# Patient Record
Sex: Female | Born: 1955 | Race: White | Hispanic: No | Smoking: Former smoker
Health system: Southern US, Community
[De-identification: ages and names within clinical notes are randomized; demographics above are authoritative.]

## PROBLEM LIST (undated history)

## (undated) DIAGNOSIS — N183 Chronic kidney disease, stage 3 unspecified: Secondary | ICD-10-CM

## (undated) DIAGNOSIS — I2510 Atherosclerotic heart disease of native coronary artery without angina pectoris: Secondary | ICD-10-CM

## (undated) DIAGNOSIS — E119 Type 2 diabetes mellitus without complications: Secondary | ICD-10-CM

## (undated) HISTORY — PX: CORONARY ARTERY BYPASS GRAFT: SHX141

---

## 2021-03-14 ENCOUNTER — Inpatient Hospital Stay (HOSPITAL_COMMUNITY): Payer: 59

## 2021-03-14 ENCOUNTER — Other Ambulatory Visit: Payer: Self-pay

## 2021-03-14 ENCOUNTER — Encounter (HOSPITAL_COMMUNITY): Payer: Self-pay | Admitting: Internal Medicine

## 2021-03-14 ENCOUNTER — Inpatient Hospital Stay (HOSPITAL_COMMUNITY)
Admission: AD | Admit: 2021-03-14 | Discharge: 2021-03-18 | DRG: 292 | Disposition: A | Discharge status: Home / Self Care | Payer: 59 | Source: Other Acute Inpatient Hospital | Attending: Cardiology | Admitting: Cardiology

## 2021-03-14 DIAGNOSIS — I5033 Acute on chronic diastolic (congestive) heart failure: Secondary | ICD-10-CM

## 2021-03-14 DIAGNOSIS — I5043 Acute on chronic combined systolic (congestive) and diastolic (congestive) heart failure: Secondary | ICD-10-CM | POA: Diagnosis present

## 2021-03-14 DIAGNOSIS — E119 Type 2 diabetes mellitus without complications: Secondary | ICD-10-CM

## 2021-03-14 DIAGNOSIS — N1831 Chronic kidney disease, stage 3a: Secondary | ICD-10-CM | POA: Diagnosis present

## 2021-03-14 DIAGNOSIS — I959 Hypotension, unspecified: Secondary | ICD-10-CM | POA: Diagnosis present

## 2021-03-14 DIAGNOSIS — Z951 Presence of aortocoronary bypass graft: Secondary | ICD-10-CM | POA: Diagnosis not present

## 2021-03-14 DIAGNOSIS — R6881 Early satiety: Secondary | ICD-10-CM | POA: Diagnosis present

## 2021-03-14 DIAGNOSIS — N183 Chronic kidney disease, stage 3 unspecified: Secondary | ICD-10-CM

## 2021-03-14 DIAGNOSIS — I429 Cardiomyopathy, unspecified: Secondary | ICD-10-CM | POA: Diagnosis present

## 2021-03-14 DIAGNOSIS — Z01818 Encounter for other preprocedural examination: Secondary | ICD-10-CM

## 2021-03-14 DIAGNOSIS — I2510 Atherosclerotic heart disease of native coronary artery without angina pectoris: Secondary | ICD-10-CM | POA: Diagnosis present

## 2021-03-14 DIAGNOSIS — Q250 Patent ductus arteriosus: Secondary | ICD-10-CM

## 2021-03-14 DIAGNOSIS — D509 Iron deficiency anemia, unspecified: Secondary | ICD-10-CM | POA: Diagnosis present

## 2021-03-14 DIAGNOSIS — I5021 Acute systolic (congestive) heart failure: Secondary | ICD-10-CM | POA: Diagnosis not present

## 2021-03-14 DIAGNOSIS — Z885 Allergy status to narcotic agent status: Secondary | ICD-10-CM | POA: Diagnosis not present

## 2021-03-14 DIAGNOSIS — I25810 Atherosclerosis of coronary artery bypass graft(s) without angina pectoris: Secondary | ICD-10-CM | POA: Diagnosis present

## 2021-03-14 DIAGNOSIS — F32A Depression, unspecified: Secondary | ICD-10-CM | POA: Diagnosis present

## 2021-03-14 DIAGNOSIS — E1122 Type 2 diabetes mellitus with diabetic chronic kidney disease: Secondary | ICD-10-CM | POA: Diagnosis present

## 2021-03-14 DIAGNOSIS — I255 Ischemic cardiomyopathy: Secondary | ICD-10-CM | POA: Diagnosis present

## 2021-03-14 DIAGNOSIS — I340 Nonrheumatic mitral (valve) insufficiency: Secondary | ICD-10-CM | POA: Diagnosis present

## 2021-03-14 DIAGNOSIS — I2582 Chronic total occlusion of coronary artery: Secondary | ICD-10-CM | POA: Diagnosis present

## 2021-03-14 DIAGNOSIS — F419 Anxiety disorder, unspecified: Secondary | ICD-10-CM | POA: Diagnosis present

## 2021-03-14 HISTORY — DX: Atherosclerotic heart disease of native coronary artery without angina pectoris: I25.10

## 2021-03-14 HISTORY — DX: Chronic kidney disease, stage 3 unspecified: N18.30

## 2021-03-14 HISTORY — DX: Type 2 diabetes mellitus without complications: E11.9

## 2021-03-14 LAB — IRON AND TIBC
Iron: 18 ug/dL — ABNORMAL LOW (ref 28–170)
Saturation Ratios: 6 % — ABNORMAL LOW (ref 10.4–31.8)
TIBC: 309 ug/dL (ref 250–450)
UIBC: 291 ug/dL

## 2021-03-14 LAB — COMPREHENSIVE METABOLIC PANEL
ALT: 20 U/L (ref 0–44)
AST: 24 U/L (ref 15–41)
Albumin: 3 g/dL — ABNORMAL LOW (ref 3.5–5.0)
Alkaline Phosphatase: 84 U/L (ref 38–126)
Anion gap: 12 (ref 5–15)
BUN: 21 mg/dL (ref 8–23)
CO2: 25 mmol/L (ref 22–32)
Calcium: 9.1 mg/dL (ref 8.9–10.3)
Chloride: 97 mmol/L — ABNORMAL LOW (ref 98–111)
Creatinine, Ser: 1.21 mg/dL — ABNORMAL HIGH (ref 0.44–1.00)
GFR, Estimated: 50 mL/min — ABNORMAL LOW (ref >60)
Glucose, Bld: 195 mg/dL — ABNORMAL HIGH (ref 70–99)
Potassium: 3.7 mmol/L (ref 3.5–5.1)
Sodium: 134 mmol/L — ABNORMAL LOW (ref 135–145)
Total Bilirubin: 0.6 mg/dL (ref 0.3–1.2)
Total Protein: 6.5 g/dL (ref 6.5–8.1)

## 2021-03-14 LAB — LIPID PANEL
Cholesterol: 64 mg/dL (ref 0–200)
HDL: 36 mg/dL — ABNORMAL LOW (ref >40)
LDL Cholesterol: 10 mg/dL (ref 0–99)
Total CHOL/HDL Ratio: 1.8 RATIO
Triglycerides: 92 mg/dL (ref <150)
VLDL: 18 mg/dL (ref 0–40)

## 2021-03-14 LAB — CBC WITH DIFFERENTIAL/PLATELET
Abs Immature Granulocytes: 0.02 10*3/uL (ref 0.00–0.07)
Basophils Absolute: 0.1 10*3/uL (ref 0.0–0.1)
Basophils Relative: 1 %
Eosinophils Absolute: 0.1 10*3/uL (ref 0.0–0.5)
Eosinophils Relative: 2 %
HCT: 27.6 % — ABNORMAL LOW (ref 36.0–46.0)
Hemoglobin: 9 g/dL — ABNORMAL LOW (ref 12.0–15.0)
Immature Granulocytes: 0 %
Lymphocytes Relative: 21 %
Lymphs Abs: 1.3 10*3/uL (ref 0.7–4.0)
MCH: 26.2 pg (ref 26.0–34.0)
MCHC: 32.6 g/dL (ref 30.0–36.0)
MCV: 80.5 fL (ref 80.0–100.0)
Monocytes Absolute: 0.6 10*3/uL (ref 0.1–1.0)
Monocytes Relative: 10 %
Neutro Abs: 4.1 10*3/uL (ref 1.7–7.7)
Neutrophils Relative %: 66 %
Platelets: 129 10*3/uL — ABNORMAL LOW (ref 150–400)
RBC: 3.43 MIL/uL — ABNORMAL LOW (ref 3.87–5.11)
RDW: 15.8 % — ABNORMAL HIGH (ref 11.5–15.5)
WBC: 6.2 10*3/uL (ref 4.0–10.5)
nRBC: 0 % (ref 0.0–0.2)

## 2021-03-14 LAB — GLUCOSE, CAPILLARY
Glucose-Capillary: 182 mg/dL — ABNORMAL HIGH (ref 70–99)
Glucose-Capillary: 174 mg/dL — ABNORMAL HIGH (ref 70–99)
Glucose-Capillary: 150 mg/dL — ABNORMAL HIGH (ref 70–99)
Glucose-Capillary: 153 mg/dL — ABNORMAL HIGH (ref 70–99)

## 2021-03-14 LAB — COOXEMETRY PANEL
Carboxyhemoglobin: 1.1 % (ref 0.5–1.5)
Methemoglobin: 0.8 % (ref 0.0–1.5)
O2 Saturation: 65.6 %
Total hemoglobin: 9.8 g/dL — ABNORMAL LOW (ref 12.0–16.0)

## 2021-03-14 LAB — MRSA NEXT GEN BY PCR, NASAL: MRSA by PCR Next Gen: NOT DETECTED

## 2021-03-14 LAB — HIV ANTIBODY (ROUTINE TESTING W REFLEX): HIV Screen 4th Generation wRfx: NONREACTIVE

## 2021-03-14 LAB — HEMOGLOBIN A1C
Hgb A1c MFr Bld: 7.6 % — ABNORMAL HIGH (ref 4.8–5.6)
Mean Plasma Glucose: 171.42 mg/dL

## 2021-03-14 LAB — T4, FREE: Free T4: 1.32 ng/dL — ABNORMAL HIGH (ref 0.61–1.12)

## 2021-03-14 LAB — FERRITIN: Ferritin: 85 ng/mL (ref 11–307)

## 2021-03-14 LAB — BRAIN NATRIURETIC PEPTIDE: B Natriuretic Peptide: 748 pg/mL — ABNORMAL HIGH (ref 0.0–100.0)

## 2021-03-14 LAB — TSH: TSH: 6.021 u[IU]/mL — ABNORMAL HIGH (ref 0.350–4.500)

## 2021-03-14 MED ORDER — FUROSEMIDE 10 MG/ML IJ SOLN
60.0000 mg | Freq: Once | INTRAMUSCULAR | Status: AC
  Administered 2021-03-14: 60 mg via INTRAVENOUS
  Filled 2021-03-14: qty 6

## 2021-03-14 MED ORDER — EMPAGLIFLOZIN 10 MG PO TABS
10.0000 mg | ORAL_TABLET | Freq: Every day | ORAL | Status: DC
  Administered 2021-03-14: 10 mg via ORAL
  Filled 2021-03-14: qty 1

## 2021-03-14 MED ORDER — MILRINONE LACTATE IN DEXTROSE 20-5 MG/100ML-% IV SOLN
0.1250 ug/kg/min | INTRAVENOUS | Status: DC
Start: 2021-03-14 — End: 2021-03-16
  Administered 2021-03-14 – 2021-03-15 (×2): 0.25 ug/kg/min via INTRAVENOUS
  Administered 2021-03-15: 0.125 ug/kg/min via INTRAVENOUS
  Filled 2021-03-14 (×3): qty 100

## 2021-03-14 MED ORDER — CLOPIDOGREL BISULFATE 75 MG PO TABS
75.0000 mg | ORAL_TABLET | Freq: Every day | ORAL | Status: DC
  Administered 2021-03-14 – 2021-03-18 (×5): 75 mg via ORAL
  Filled 2021-03-14 (×5): qty 1

## 2021-03-14 MED ORDER — SODIUM CHLORIDE 0.9% FLUSH: 3.0000 mL | INTRAVENOUS | Status: DC | PRN

## 2021-03-14 MED ORDER — ONDANSETRON HCL 4 MG/2ML IJ SOLN: 4.0000 mg | Freq: Four times a day (QID) | INTRAMUSCULAR | Status: DC | PRN

## 2021-03-14 MED ORDER — POTASSIUM CHLORIDE CRYS ER 20 MEQ PO TBCR
40.0000 meq | EXTENDED_RELEASE_TABLET | Freq: Once | ORAL | Status: AC
  Administered 2021-03-14: 40 meq via ORAL
  Filled 2021-03-14: qty 2

## 2021-03-14 MED ORDER — ASPIRIN EC 81 MG PO TBEC
81.0000 mg | DELAYED_RELEASE_TABLET | Freq: Every day | ORAL | Status: DC
  Administered 2021-03-14 – 2021-03-18 (×5): 81 mg via ORAL
  Filled 2021-03-14 (×5): qty 1

## 2021-03-14 MED ORDER — FUROSEMIDE 10 MG/ML IJ SOLN
80.0000 mg | Freq: Two times a day (BID) | INTRAMUSCULAR | Status: DC
  Administered 2021-03-14: 80 mg via INTRAVENOUS
  Filled 2021-03-14: qty 8

## 2021-03-14 MED ORDER — SPIRONOLACTONE 25 MG PO TABS
25.0000 mg | ORAL_TABLET | Freq: Every day | ORAL | Status: DC
Start: 2021-03-14 — End: 2021-03-18
  Administered 2021-03-14 – 2021-03-18 (×5): 25 mg via ORAL
  Filled 2021-03-14 (×5): qty 1

## 2021-03-14 MED ORDER — SODIUM CHLORIDE 0.9 % IV SOLN
510.0000 mg | Freq: Once | INTRAVENOUS | Status: AC
  Administered 2021-03-14: 510 mg via INTRAVENOUS
  Filled 2021-03-14: qty 17

## 2021-03-14 MED ORDER — SODIUM CHLORIDE 0.9% FLUSH: 10.0000 mL | INTRAVENOUS | Status: DC | PRN

## 2021-03-14 MED ORDER — DIGOXIN 125 MCG PO TABS
0.1250 mg | ORAL_TABLET | Freq: Every day | ORAL | Status: DC
  Administered 2021-03-14 – 2021-03-17 (×4): 0.125 mg via ORAL
  Filled 2021-03-14 (×4): qty 1

## 2021-03-14 MED ORDER — ENSURE ENLIVE PO LIQD
237.0000 mL | Freq: Two times a day (BID) | ORAL | Status: DC
  Administered 2021-03-14 – 2021-03-18 (×7): 237 mL via ORAL

## 2021-03-14 MED ORDER — SODIUM CHLORIDE 0.9% FLUSH
3.0000 mL | Freq: Two times a day (BID) | INTRAVENOUS | Status: DC
  Administered 2021-03-14 – 2021-03-18 (×6): 3 mL via INTRAVENOUS

## 2021-03-14 MED ORDER — SERTRALINE HCL 25 MG PO TABS
50.0000 mg | ORAL_TABLET | Freq: Every day | ORAL | Status: DC
  Administered 2021-03-14 – 2021-03-18 (×5): 50 mg via ORAL
  Filled 2021-03-14 (×5): qty 2

## 2021-03-14 MED ORDER — SODIUM CHLORIDE 0.9% FLUSH
10.0000 mL | Freq: Two times a day (BID) | INTRAVENOUS | Status: DC
  Administered 2021-03-14 – 2021-03-17 (×6): 10 mL
  Administered 2021-03-17: 40 mL
  Administered 2021-03-18: 10 mL

## 2021-03-14 MED ORDER — ACETAMINOPHEN 325 MG PO TABS: 650.0000 mg | ORAL_TABLET | ORAL | Status: DC | PRN

## 2021-03-14 MED ORDER — ATORVASTATIN CALCIUM 80 MG PO TABS
80.0000 mg | ORAL_TABLET | Freq: Every day | ORAL | Status: DC
  Administered 2021-03-14 – 2021-03-18 (×5): 80 mg via ORAL
  Filled 2021-03-14 (×5): qty 1

## 2021-03-14 MED ORDER — INSULIN ASPART 100 UNIT/ML IJ SOLN
0.0000 [IU] | Freq: Three times a day (TID) | INTRAMUSCULAR | Status: DC
Start: 2021-03-14 — End: 2021-03-18
  Administered 2021-03-14: 3 [IU] via SUBCUTANEOUS
  Administered 2021-03-14: 2 [IU] via SUBCUTANEOUS
  Administered 2021-03-14 – 2021-03-16 (×7): 3 [IU] via SUBCUTANEOUS
  Administered 2021-03-17: 2 [IU] via SUBCUTANEOUS
  Administered 2021-03-17 – 2021-03-18 (×4): 3 [IU] via SUBCUTANEOUS

## 2021-03-14 MED ORDER — ENOXAPARIN SODIUM 40 MG/0.4ML IJ SOSY
40.0000 mg | PREFILLED_SYRINGE | INTRAMUSCULAR | Status: DC
  Administered 2021-03-14 – 2021-03-18 (×4): 40 mg via SUBCUTANEOUS
  Filled 2021-03-14 (×5): qty 0.4

## 2021-03-14 MED ORDER — BUSPIRONE HCL 10 MG PO TABS
5.0000 mg | ORAL_TABLET | Freq: Three times a day (TID) | ORAL | Status: DC
  Administered 2021-03-14 – 2021-03-18 (×13): 5 mg via ORAL
  Filled 2021-03-14 (×13): qty 1

## 2021-03-14 MED ORDER — INSULIN ASPART 100 UNIT/ML IJ SOLN
0.0000 [IU] | Freq: Every day | INTRAMUSCULAR | Status: DC
  Administered 2021-03-15 – 2021-03-17 (×3): 2 [IU] via SUBCUTANEOUS

## 2021-03-14 MED ORDER — DAPAGLIFLOZIN PROPANEDIOL 10 MG PO TABS
10.0000 mg | ORAL_TABLET | Freq: Every day | ORAL | Status: DC
  Administered 2021-03-15 – 2021-03-18 (×4): 10 mg via ORAL
  Filled 2021-03-14 (×4): qty 1

## 2021-03-14 MED ORDER — SODIUM CHLORIDE 0.9 % IV SOLN: 250.0000 mL | INTRAVENOUS | Status: DC | PRN

## 2021-03-14 MED ORDER — CHLORHEXIDINE GLUCONATE CLOTH 2 % EX PADS
6.0000 | MEDICATED_PAD | Freq: Every day | CUTANEOUS | Status: DC
  Administered 2021-03-14 – 2021-03-17 (×4): 6 via TOPICAL

## 2021-03-14 NOTE — Plan of Care (Signed)
Initiation of Care Plan Problem: Education: Goal: Knowledge of General Education information will improve Description: Including pain rating scale, medication(s)/side effects and non-pharmacologic comfort measures Outcome: Progressing   Problem: Health Behavior/Discharge Planning: Goal: Ability to manage health-related needs will improve Outcome: Progressing   Problem: Clinical Measurements: Goal: Ability to maintain clinical measurements within normal limits will improve Outcome: Progressing Goal: Will remain free from infection Outcome: Progressing Goal: Diagnostic test results will improve Outcome: Progressing Goal: Respiratory complications will improve Outcome: Progressing Goal: Cardiovascular complication will be avoided Outcome: Progressing   Problem: Activity: Goal: Risk for activity intolerance will decrease Outcome: Progressing   Problem: Nutrition: Goal: Adequate nutrition will be maintained Outcome: Progressing   Problem: Coping: Goal: Level of anxiety will decrease Outcome: Progressing   Problem: Elimination: Goal: Will not experience complications related to bowel motility Outcome: Progressing Goal: Will not experience complications related to urinary retention Outcome: Progressing   Problem: Pain Managment: Goal: General experience of comfort will improve Outcome: Progressing   Problem: Safety: Goal: Ability to remain free from injury will improve Outcome: Progressing   Problem: Skin Integrity: Goal: Risk for impaired skin integrity will decrease Outcome: Progressing   Problem: Education: Goal: Ability to demonstrate management of disease process will improve Outcome: Progressing Goal: Ability to verbalize understanding of medication therapies will improve Outcome: Progressing Goal: Individualized Educational Video(s) Outcome: Progressing   Problem: Activity: Goal: Capacity to carry out activities will improve Outcome: Progressing    Problem: Cardiac: Goal: Ability to achieve and maintain adequate cardiopulmonary perfusion will improve Outcome: Progressing   

## 2021-03-14 NOTE — H&P (Signed)
Cardiology Admission History and Physical:   Patient ID: Andrea Mueller MRN: [id number]; DOB: Mar 02, 1956   Admission date: 03/14/2021  PCP:  Jeanice LimHaubert, Deborah Anne, PA-C   [facility] Providers Cardiologist:  None   Chief Complaint:  "My blood pressure dropped after they stopped this medicine so they brought me over here"  Patient Profile:   Andrea Mueller is a 65 y.o. female with hx of CAD s/p 4v CABG (11/2020), type 2 diabetes mellitus, anxiety/depression, CKD III, and recently diagnosed ICM who is being seen 03/14/2021 for the evaluation of acute on chronic systolic and diastolic heart failure.  History of Present Illness:   Andrea Mueller' was diagnosed with coronary artery disease back in April of this year after presenting to a hospital in [place] with fatigue and diaphoresis.  She underwent four-vessel CABG in [place] and appeared to initially recover quite well.  She returned to [place] in [place] in May, and was undergoing home physical therapy.  She was hospitalized in May in [place] for what appears to be decompensated heart failure and right lower leg cellulitis.  Her echocardiogram at that time revealed normal LV systolic function.  After discharge, [name] tells me that she was actually doing quite well.  She was continuing to work with home physical therapy, and was back to doing a lot of her household chores without any significant functionally limiting symptoms.  In early to mid July, however, she noted that she was starting to become a little more winded and felt pressure in her chest.  She presented to [facility] in [place] and was found to be in acute heart failure.  Her echocardiogram then revealed new, significant LV systolic dysfunction with moderate mitral regurgitation.  She underwent a left heart catheterization which revealed severe native disease, and occluded vein graft to a diagonal, and otherwise patent bypass grafts.  She had elevated biventricular  filling pressures with a normal cardiac index, although this was on a small amount of dobutamine.  She was diuresed, and the team there attempted to wean her off of inotropes.  She reports that during this process she felt as though fluid quickly came back on.  Yesterday her she was reportedly hypotensive, and inotropes were restarted.  She was transferred here for further management of her cardiomyopathy and consideration of advanced therapies.  Andrea Mueller tells me that prior to April she was quite functional.  She was working, doing all of her household chores, and handling 3 dogs without any functionally limiting symptoms.  Absent her hospitalization in May, she really did feel like she was recovering well from bypass surgery until this month.  This morning she feels as though she still has quite a bit of fluid on board.  She has a tightness in her chest that she says is typical when she has extra fluid.  She has noted a decrease in her appetite along with early satiety.  She did not notice when her blood pressure was low, but did feel like the last couple of days she was more winded when walking in the halls.  She has never had to have medications decreased or stopped in the outpatient setting because of hypotension.  She has not had any recent palpitations or syncope.   Past Medical History:  Diagnosis Date   CAD (coronary artery disease), native coronary artery    CKD (chronic kidney disease) stage 3, GFR 30-59 ml/min (HCC)    Type 2 diabetes mellitus treated without insulin (HCC)  Past Surgical History:  Procedure Laterality Date   CORONARY ARTERY BYPASS GRAFT       Medications Prior to Admission: Prior to Admission medications   Not on File     Allergies:    Allergies  Allergen Reactions   Codeine Nausea Only    Social History:   Social History   Socioeconomic History   Marital status: Widowed    Spouse name: Not on file   Number of children: Not on file   Years of  education: Not on file   Highest education level: Not on file  Occupational History   Not on file  Tobacco Use   Smoking status: Not on file   Smokeless tobacco: Not on file  Substance and Sexual Activity   Alcohol use: Not on file   Drug use: Not on file   Sexual activity: Not on file  Other Topics Concern   Not on file  Social History Narrative   Not on file   Social Determinants of Health   Financial Resource Strain: Not on file  Food Insecurity: Not on file  Transportation Needs: Not on file  Physical Activity: Not on file  Stress: Not on file  Social Connections: Not on file  Intimate Partner Violence: Not on file    Family History:   The patient's family history is not on file.    ROS:  Please see the history of present illness.  All other ROS reviewed and negative.     Physical Exam/Data:   Vitals:   03/14/21 0327 03/14/21 0332  BP: (!) 102/49 (!) 113/53  Pulse: 89 87  Resp: 18 13  Temp: 98.1 F (36.7 C)   TempSrc: Oral   SpO2: 97%   Weight: 81.6 kg   Height: 5\' 3"  (1.6 m)    No intake or output data in the 24 hours ending 03/14/21 0535 Last 3 Weights 03/14/2021  Weight (lbs) 179 lb 14.3 oz  Weight (kg) 81.6 kg     Body mass index is 31.87 kg/m.  General:  Well nourished, well developed, in no acute distress HEENT: normal Lymph: no adenopathy Neck: Neck veins 2-3 cm below the mandible at approx 60 degrees w/ HJR Endocrine:  No thryomegaly Vascular: No carotid bruits; FA pulses 2+ bilaterally without bruits  Cardiac:  normal S1, S2; RRR; no murmur  Lungs:  clear to auscultation bilaterally, no wheezing, rhonchi or rales  Abd: soft, nontender, no hepatomegaly  Ext: no edema Musculoskeletal:  No deformities, BUE and BLE strength normal and equal Skin: warm and dry  Neuro:  CNs 2-12 intact, no focal abnormalities noted Psych:  Normal affect   EKG:  The ECG is pending   Relevant CV Studies: Outside LHC (03/04/21): Coronary Angiography  1. Left  Main -no significant stenosis  2. Left anterior descending artery -occluded at the midportion of the  vessel  3. Diagonals -diagonal 1 is a very small vessel with diffuse 80 to 90%  stenosis  4. Left Circumflex -occluded at the midportion of the vessel  5. Obtuse Marginals - OM1 is a very small vessel  6. Right Coronary Artery -occluded at the midportion of the vessel    Graft angiography  SVG to the diagonal is subtotally occluded  SVG to OM is patent.  There is 90% stenosis at the anastomosis to the  native vasculature however the vessel was very small (less than 2 mm)  SVG to RCA is widely patent.  The distal right coronary artery and  PDA are  small vessels  LIMA to the LAD is widely patent however the LIMA ties into a very small  diffusely diseased distal LAD    Outside RHC (03/04/21):  1. Right atrial pressure mean of 13 mmHg.  2. Right ventricular pressure 55/10 mmHg.  3. Pulmonary artery pressure 55/19 mmHg with a mean of 33 mmHg.  4. Pulmonary capillary wedge pressure 25 mmHg. Large V waves noted  5. Left ventricular pressure 105/56 mmHg.  6. Aortic pressure 105/30 mmHg.   LVEDP ranges from 25 to 30 mmHg  7. Pulmonary artery saturation 59%.  8. Aortic saturation 91.7%.  9. Calculated cardiac output of 5.85 L/minute by Fick with an index of  3.12 L/minute/m2.   Outside TTE (03/03/21): Left Ventricle: EF: 25-30%.    Left Ventricle: There is preserved function at the base of the LV with  severe hypokinesis to akinesis elsewhere.    Compared to prior 12/2020, LVEF is severely reduced and MR is moderate.  Laboratory Data:  High Sensitivity Troponin:  No results for input(s): TROPONINIHS in the last 720 hours.    Chemistry Recent Labs  Lab 03/14/21 0500  NA 134*  K 3.7  CL 97*  CO2 25  GLUCOSE 195*  BUN 21  CREATININE 1.21*  CALCIUM 9.1  GFRNONAA 50*  ANIONGAP 12    Recent Labs  Lab 03/14/21 0500  PROT 6.5  ALBUMIN 3.0*  AST 24  ALT 20  ALKPHOS 84   BILITOT 0.6   Hematology Recent Labs  Lab 03/14/21 0500  WBC 6.2  RBC 3.43*  HGB 9.0*  HCT 27.6*  MCV 80.5  MCH 26.2  MCHC 32.6  RDW 15.8*  PLT 129*   BNPNo results for input(s): BNP, PROBNP in the last 168 hours.  DDimer No results for input(s): DDIMER in the last 168 hours.   Radiology/Studies:  No results found.   Assessment and Plan:   #Acute Systolic and Diastolic Heart Failure #ICM - Has now had 2 hospitalizations for ADHF since bypass in April. Previously her LVSF was normal, but had a significant drop in function this month. Was started on inotropes at OSH. Biventricular filling pressures were elevated, and seemed to respond well to diurese. Inotropes were weaned to off, but Ms. Asch developed recurrent symptomatic HF with hypotension  - Clinically, looks reasonably well today on dual inotropic support. Symptomatically feels congested and does have some volume on exam.  - She did have a graft to a diag go down, but I'm not sure this explains her decompensation. Without being able to personally review the images, she appears to have typical, small diabetic vessels and we may just now be seeing the manifestations of her advanced coronary disease.  - For now, I think we can try to optimize her from a HF standpoint and then retrial a weaning of inotropes. If this is truly not successful with good medical therapy, then it would be reasonable to consider advanced therapies. Would need to see where her renal function settles out, but she would seemingly be a good candidate for both OHT and durable VAD - Continue milrinone for now. Stop dobutamine  - Once we have labs back, would restart IV diuresis  - Start aldactone + empa  - As long as she is tolerating, these would trial low dose ACE and subsequently attempt a wean of milrinone once filling pressures optimized - Check iron panel  - Echo in the AM   #CAD: - Severe native disease with 3/4  patent grafts - Cont DAPT for  now, although will have to let plavix washout if needing advanced therapies  - Cont atorvastatin  - Check lipids, A1c  #Type 2 Diabetes Mellitus: Seemingly well controlled - SSI for now - Check A1c  - Empa as above   #CKD III: - Baseline in the low-mid 1s. 1.2 here this AM - Diuresis as above  Risk Assessment/Risk Scores:  New [place] Heart Association (NYHA) Functional Class NYHA Class III   Severity of Illness: The appropriate patient status for this patient is INPATIENT. Inpatient status is judged to be reasonable and necessary in order to provide the required intensity of service to ensure the patient's safety. The patient's presenting symptoms, physical exam findings, and initial radiographic and laboratory data in the context of their chronic comorbidities is felt to place them at high risk for further clinical deterioration. Furthermore, it is not anticipated that the patient will be medically stable for discharge from the hospital within 2 midnights of admission. The following factors support the patient status of inpatient.   " The patient's presenting symptoms include SOB, chest pain. " The worrisome physical exam findings include elevated neck veins. " The initial radiographic and laboratory data are worrisome because of evidence of pulmonary edema, elevated filling pressures. " The chronic co-morbidities include CKD, diabetes.  * I certify that at the point of admission it is my clinical judgment that the patient will require inpatient hospital care spanning beyond 2 midnights from the point of admission due to high intensity of service, high risk for further deterioration and high frequency of surveillance required.*   For questions or updates, please contact [facility] Please consult [email or web address] for contact info under     Signed, Livingston Diones, MD  03/14/2021 5:35 AM

## 2021-03-14 NOTE — Progress Notes (Signed)
Patient ID: Andrea Mueller, female   DOB: Jun 03, 1956, 65 y.o.   MRN: [id number]     Advanced Heart Failure Rounding Note  PCP-Cardiologist: None   Subjective:    No complaints this morning.  SBP 90s-100s.  She had a dose of IV Lasix this morning.   Outside facilities:  - 4/22 CABG: SVG-D, SVG-OM, LIMA-LAD, SVG RCA - 7/22 Coronary angiography: Totally occluded mid LAD, LCx, and RCA; 80-90% stenosis small D1; patent LIMA-small, diseased LAD, patent SVG-RCA with small PDA and PLV, patent SVG-OM but 90% stenosis in small native vessel at anastomosis, occluded SVG-D.  - 7/22 RHC: mean RA 12, PA 55/19 mean 33, mean PCWP 25, CI 3.12 (on inotrope) - 7/22 echo: EF 25-30%, moderate MR  Objective:   Weight Range: 81.6 kg Body mass index is 31.87 kg/m.   Vital Signs:   Temp:  [98.1 F (36.7 C)] 98.1 F (36.7 C) (07/30 0327) Pulse Rate:  [80-89] 80 (07/30 0755) Resp:  [13-20] 18 (07/30 0755) BP: (97-113)/(49-61) 97/58 (07/30 0755) SpO2:  [96 %-98 %] 96 % (07/30 0755) Weight:  [81.6 kg] 81.6 kg (07/30 0327) Last BM Date: 03/14/21  Weight change: Filed Weights   03/14/21 0327  Weight: 81.6 kg    Intake/Output:   Intake/Output Summary (Last 24 hours) at 03/14/2021 1032 Last data filed at 03/14/2021 0838 Gross per 24 hour  Intake 300.04 ml  Output 300 ml  Net 0.04 ml      Physical Exam    General:  Well appearing. No resp difficulty HEENT: Normal Neck: Supple. JVP 14 cm. Carotids 2+ bilat; no bruits. No lymphadenopathy or thyromegaly appreciated. Cor: PMI nondisplaced. Regular rate & rhythm. No murmur, soft S3 Lungs: Clear Abdomen: Soft, nontender, nondistended. No hepatosplenomegaly. No bruits or masses. Good bowel sounds. Extremities: No cyanosis, clubbing, rash. Trace ankle edema.  Neuro: Alert & orientedx3, cranial nerves grossly intact. moves all 4 extremities w/o difficulty. Affect pleasant   Telemetry   NSR 80s (personally reviewed)  EKG    NSR, 1st degree AVB,  RBBB, inferior MI, ALMI (personally reviewed)  Labs    CBC Recent Labs    03/14/21 0500  WBC 6.2  NEUTROABS 4.1  HGB 9.0*  HCT 27.6*  MCV 80.5  PLT 129*   Basic Metabolic Panel Recent Labs    51/70/01 0500  NA 134*  K 3.7  CL 97*  CO2 25  GLUCOSE 195*  BUN 21  CREATININE 1.21*  CALCIUM 9.1   Liver Function Tests Recent Labs    03/14/21 0500  AST 24  ALT 20  ALKPHOS 84  BILITOT 0.6  PROT 6.5  ALBUMIN 3.0*   No results for input(s): LIPASE, AMYLASE in the last 72 hours. Cardiac Enzymes No results for input(s): CKTOTAL, CKMB, CKMBINDEX, TROPONINI in the last 72 hours.  BNP: BNP (last 3 results) Recent Labs    03/14/21 0500  BNP 748.0*    ProBNP (last 3 results) No results for input(s): PROBNP in the last 8760 hours.   D-Dimer No results for input(s): DDIMER in the last 72 hours. Hemoglobin A1C Recent Labs    03/14/21 0500  HGBA1C 7.6*   Fasting Lipid Panel Recent Labs    03/14/21 0500  CHOL 64  HDL 36*  LDLCALC 10  TRIG 92  CHOLHDL 1.8   Thyroid Function Tests Recent Labs    03/14/21 0500  TSH 6.021*    Other results:   Imaging    No results found.   Medications:  Scheduled Medications:  aspirin EC  81 mg Oral Daily   atorvastatin  80 mg Oral Daily   busPIRone  5 mg Oral TID   Chlorhexidine Gluconate Cloth  6 each Topical Daily   clopidogrel  75 mg Oral Daily   [START ON 03/15/2021] dapagliflozin propanediol  10 mg Oral Daily   digoxin  0.125 mg Oral Daily   enoxaparin (LOVENOX) injection  40 mg Subcutaneous Q24H   feeding supplement  237 mL Oral BID BM   insulin aspart  0-15 Units Subcutaneous TID WC   insulin aspart  0-5 Units Subcutaneous QHS   sertraline  50 mg Oral Daily   sodium chloride flush  10-40 mL Intracatheter Q12H   sodium chloride flush  3 mL Intravenous Q12H   spironolactone  25 mg Oral Daily    Infusions:  sodium chloride     milrinone 0.25 mcg/kg/min (03/14/21 0559)    PRN  Medications: sodium chloride, acetaminophen, ondansetron (ZOFRAN) IV, sodium chloride flush, sodium chloride flush   Assessment/Plan   1. CAD: s/p CABG x 4 4/22.  Cath in 7/22 with 3/4 grafts patent, occluded SVG-small D.  Native vessels, however, were small and diseased (diabetic coronaries).  No chest pain.  - Continue ASA 81 - Continue Plavix for now.  - Continue atorvastatin, good LDL.  2. Acute systolic CHF: Ischemic cardiomyopathy.  Echo at [facility] with EF 25-30%, moderate MR.  She was admitted to [facility] with CHF, started on inotropes and diuresed.  RHC showed elevated filling pressures and preserved CO (but was on inotropes at the time).  Unable to wean off inotropes successfully, sent here for consideration of LVAD.  She was on both dobutamine and milrinone at arrival, now off dobutamine and on milrinone 0.25. On exam, she is volume overloaded.  - Follow CVP (set up now).  - Send co-ox on milrinone 0.25.  Will not wean until she is diuresed.  - Farxiga 10 mg daily and spironolactone 25 daily.   - Add digoxin 0.125 daily.  - Lasix 80 mg IV bid for now, follow I/Os closely.  - Will see how she does with diuresis and then weaning inotropes.  Hopefuly can control with meds, but she appears to be a candidate for LVAD or OHT if needed.  3. Type 2 DM: HgbA1c 7.6.   - SSI, Farxiga for now.  4. Fe deficiency anemia:  - FOBT - feraheme dose.   Marca Ancona 03/14/2021 10:44 AM   Length of Stay: 0  Marca Ancona, MD  03/14/2021, 10:32 AM  Advanced Heart Failure Team Pager [phone] (M-F; 7a - 5p)  Please contact [facility] for night-coverage after hours (5p -7a ) and weekends on [email or web address]

## 2021-03-14 NOTE — Progress Notes (Signed)
CARDIAC REHAB PHASE I   PRE:  Rate/Rhythm: 90 SR up in room    BP: sitting 107/65    SaO2: 98 RA  MODE:  Ambulation: 280 ft   POST:  Rate/Rhythm: 86 SR    BP: sitting 106/59     SaO2: 97 RA  Pt moving around in room independently. Ambulated hall at controlled pace. Some fatigue/SOB at end of walk but overall feels "better than she did". VSS. Family supportive. She can walk hall independently. [id number]   Harriet Masson CES, ACSM 03/14/2021 2:54 PM

## 2021-03-15 ENCOUNTER — Inpatient Hospital Stay (HOSPITAL_COMMUNITY): Payer: 59

## 2021-03-15 DIAGNOSIS — I5021 Acute systolic (congestive) heart failure: Secondary | ICD-10-CM | POA: Diagnosis not present

## 2021-03-15 DIAGNOSIS — I5043 Acute on chronic combined systolic (congestive) and diastolic (congestive) heart failure: Secondary | ICD-10-CM | POA: Diagnosis not present

## 2021-03-15 LAB — ECHOCARDIOGRAM COMPLETE
Area-P 1/2: 3.31 cm2
Calc EF: 37.9 %
Height: 63 in
S' Lateral: 3.5 cm
Single Plane A2C EF: 31.4 %
Single Plane A4C EF: 44.2 %
Weight: 2804.25 oz

## 2021-03-15 LAB — COOXEMETRY PANEL
Carboxyhemoglobin: 1.2 % (ref 0.5–1.5)
Methemoglobin: 1.4 % (ref 0.0–1.5)
O2 Saturation: 70.5 %
Total hemoglobin: 9.9 g/dL — ABNORMAL LOW (ref 12.0–16.0)

## 2021-03-15 LAB — BASIC METABOLIC PANEL
Anion gap: 10 (ref 5–15)
BUN: 16 mg/dL (ref 8–23)
CO2: 28 mmol/L (ref 22–32)
Calcium: 9.1 mg/dL (ref 8.9–10.3)
Chloride: 99 mmol/L (ref 98–111)
Creatinine, Ser: 1.22 mg/dL — ABNORMAL HIGH (ref 0.44–1.00)
GFR, Estimated: 50 mL/min — ABNORMAL LOW (ref >60)
Glucose, Bld: 155 mg/dL — ABNORMAL HIGH (ref 70–99)
Potassium: 3.8 mmol/L (ref 3.5–5.1)
Sodium: 137 mmol/L (ref 135–145)

## 2021-03-15 LAB — GLUCOSE, CAPILLARY
Glucose-Capillary: 159 mg/dL — ABNORMAL HIGH (ref 70–99)
Glucose-Capillary: 182 mg/dL — ABNORMAL HIGH (ref 70–99)
Glucose-Capillary: 171 mg/dL — ABNORMAL HIGH (ref 70–99)
Glucose-Capillary: 243 mg/dL — ABNORMAL HIGH (ref 70–99)

## 2021-03-15 MED ORDER — TORSEMIDE 20 MG PO TABS
20.0000 mg | ORAL_TABLET | Freq: Every day | ORAL | Status: DC
  Administered 2021-03-15 – 2021-03-16 (×2): 20 mg via ORAL
  Filled 2021-03-15 (×2): qty 1

## 2021-03-15 MED ORDER — PERFLUTREN LIPID MICROSPHERE
1.0000 mL | INTRAVENOUS | Status: AC | PRN
  Administered 2021-03-15: 1 mL via INTRAVENOUS
  Filled 2021-03-15: qty 10

## 2021-03-15 MED ORDER — ADULT MULTIVITAMIN W/MINERALS CH
1.0000 | ORAL_TABLET | Freq: Every day | ORAL | Status: DC
  Administered 2021-03-16 – 2021-03-18 (×3): 1 via ORAL
  Filled 2021-03-15 (×3): qty 1

## 2021-03-15 MED ORDER — LOSARTAN POTASSIUM 25 MG PO TABS
12.5000 mg | ORAL_TABLET | Freq: Two times a day (BID) | ORAL | Status: DC
  Administered 2021-03-15 – 2021-03-17 (×5): 12.5 mg via ORAL
  Filled 2021-03-15 (×6): qty 1

## 2021-03-15 NOTE — Plan of Care (Signed)
  Problem: Education: Goal: Knowledge of General Education information will improve Description: Including pain rating scale, medication(s)/side effects and non-pharmacologic comfort measures Outcome: Progressing   Problem: Health Behavior/Discharge Planning: Goal: Ability to manage health-related needs will improve Outcome: Progressing   Problem: Clinical Measurements: Goal: Ability to maintain clinical measurements within normal limits will improve Outcome: Progressing Goal: Will remain free from infection Outcome: Progressing Goal: Diagnostic test results will improve Outcome: Progressing Goal: Respiratory complications will improve Outcome: Progressing Goal: Cardiovascular complication will be avoided Outcome: Progressing   Problem: Activity: Goal: Risk for activity intolerance will decrease Outcome: Progressing   Problem: Nutrition: Goal: Adequate nutrition will be maintained Outcome: Progressing   Problem: Coping: Goal: Level of anxiety will decrease Outcome: Progressing   Problem: Elimination: Goal: Will not experience complications related to bowel motility Outcome: Progressing Goal: Will not experience complications related to urinary retention Outcome: Progressing   Problem: Pain Managment: Goal: General experience of comfort will improve Outcome: Progressing   Problem: Safety: Goal: Ability to remain free from injury will improve Outcome: Progressing   Problem: Education: Goal: Ability to demonstrate management of disease process will improve Outcome: Progressing Goal: Ability to verbalize understanding of medication therapies will improve Outcome: Progressing Goal: Individualized Educational Video(s) Outcome: Progressing   Problem: Skin Integrity: Goal: Risk for impaired skin integrity will decrease Outcome: Progressing   Problem: Activity: Goal: Capacity to carry out activities will improve Outcome: Progressing   Problem: Cardiac: Goal:  Ability to achieve and maintain adequate cardiopulmonary perfusion will improve Outcome: Progressing   Problem: Education: Goal: Ability to describe self-care measures that may prevent or decrease complications (Diabetes Survival Skills Education) will improve Outcome: Progressing Goal: Individualized Educational Video(s) Outcome: Progressing   Problem: Coping: Goal: Ability to adjust to condition or change in health will improve Outcome: Progressing   Problem: Fluid Volume: Goal: Ability to maintain a balanced intake and output will improve Outcome: Progressing   Problem: Health Behavior/Discharge Planning: Goal: Ability to identify and utilize available resources and services will improve Outcome: Progressing Goal: Ability to manage health-related needs will improve Outcome: Progressing   Problem: Metabolic: Goal: Ability to maintain appropriate glucose levels will improve Outcome: Progressing   Problem: Nutritional: Goal: Maintenance of adequate nutrition will improve Outcome: Progressing Goal: Progress toward achieving an optimal weight will improve Outcome: Progressing   Problem: Skin Integrity: Goal: Risk for impaired skin integrity will decrease Outcome: Progressing   Problem: Tissue Perfusion: Goal: Adequacy of tissue perfusion will improve Outcome: Progressing

## 2021-03-15 NOTE — Progress Notes (Signed)
Echocardiogram 2D Echocardiogram has been performed.  Warren Lacy Ermagene Saidi RDCS 03/15/2021, 12:48 PM

## 2021-03-15 NOTE — Discharge Instructions (Signed)

## 2021-03-15 NOTE — Plan of Care (Signed)
?  Problem: Education: ?Goal: Knowledge of General Education information will improve ?Description: Including pain rating scale, medication(s)/side effects and non-pharmacologic comfort measures ?Outcome: Progressing ?  ?Problem: Health Behavior/Discharge Planning: ?Goal: Ability to manage health-related needs will improve ?Outcome: Progressing ?  ?Problem: Clinical Measurements: ?Goal: Ability to maintain clinical measurements within normal limits will improve ?Outcome: Progressing ?Goal: Will remain free from infection ?Outcome: Progressing ?Goal: Diagnostic test results will improve ?Outcome: Progressing ?Goal: Respiratory complications will improve ?Outcome: Progressing ?Goal: Cardiovascular complication will be avoided ?Outcome: Progressing ?  ?Problem: Activity: ?Goal: Risk for activity intolerance will decrease ?Outcome: Progressing ?  ?Problem: Nutrition: ?Goal: Adequate nutrition will be maintained ?Outcome: Progressing ?  ?Problem: Coping: ?Goal: Level of anxiety will decrease ?Outcome: Progressing ?  ?Problem: Elimination: ?Goal: Will not experience complications related to bowel motility ?Outcome: Progressing ?Goal: Will not experience complications related to urinary retention ?Outcome: Progressing ?  ?Problem: Pain Managment: ?Goal: General experience of comfort will improve ?Outcome: Progressing ?  ?Problem: Safety: ?Goal: Ability to remain free from injury will improve ?Outcome: Progressing ?  ?Problem: Skin Integrity: ?Goal: Risk for impaired skin integrity will decrease ?Outcome: Progressing ?  ?Problem: Education: ?Goal: Ability to demonstrate management of disease process will improve ?Outcome: Progressing ?Goal: Ability to verbalize understanding of medication therapies will improve ?Outcome: Progressing ?Goal: Individualized Educational Video(s) ?Outcome: Progressing ?  ?Problem: Activity: ?Goal: Capacity to carry out activities will improve ?Outcome: Progressing ?  ?

## 2021-03-15 NOTE — Progress Notes (Signed)
Initial Nutrition Assessment  DOCUMENTATION CODES:   Not applicable  INTERVENTION:  -Ensure Enlive po BID, each supplement provides 350 kcal and 20 grams of protein -MVI with minerals daily -provided education on increasing kcal/protein intake  NUTRITION DIAGNOSIS:   Inadequate oral intake related to early satiety, decreased appetite as evidenced by per patient/family report.  GOAL:   Patient will meet greater than or equal to 90% of their needs  MONITOR:   PO intake, Supplement acceptance, Labs, Weight trends, I & O's  REASON FOR ASSESSMENT:   Malnutrition Screening Tool    ASSESSMENT:   Pt with PMH significant for CAD s/p 4v CABG (11/2020), type 2 DM, anxiety/depression, CKD III, and recently diagnosed ICM admitted with heart failure  Pt reports that prior to April, she was able to perform all ADLs independently and had no issues with appetite. Absent her hospitalization in May, pt reports feeling like she was recovering well from her bypass surgery up until this month. Reports she has been experiencing decreased appetite/early satiety. Will provide pt with oral nutrition supplements and education regarding increasing kcal/protein intake.   PO Intake: 50-90% x 3 recorded meals (72% average intake)  Medications: farxiga, SSI, aldactone Labs: Cr 1.22 (H) CBGs 159-243  UOP: x24 hours I/O: - since admit  Non-pitting edema to BUE and mild pitting edema noted to BLE per RN assessment  NUTRITION - FOCUSED PHYSICAL EXAM: Unable to perform at this time as RD is working remotely; will attempt at follow-up.   Diet Order:   Diet Order             Diet heart healthy/carb modified Room service appropriate? Yes; Fluid consistency: Thin  Diet effective now                   EDUCATION NEEDS:   Education needs have been addressed  Skin:  Skin Assessment: Reviewed RN Assessment  Last BM:  7/30 type 4  Height:   Ht Readings from Last 1 Encounters:   03/14/21 5\' 3"  (1.6 m)    Weight:   Wt Readings from Last 1 Encounters:  03/15/21 79.5 kg    BMI:  Body mass index is 31.05 kg/m.  Estimated Nutritional Needs:   Kcal:  1700-1900  Protein:  85-95 grams  Fluid:  >1.7L/d    03/17/21, MS, RD, LDN (she/her/hers) RD pager number and weekend/on-call pager number located in Amion.

## 2021-03-15 NOTE — Progress Notes (Signed)
Patient ID: Krisann Mckenna, female   DOB: [birth year], 65 y.o.   MRN: [id number]     Advanced Heart Failure Rounding Note  PCP-Cardiologist: None   Subjective:    No complaints this morning.  SBP 100s.  Good diuresis yesterday with IV Lasix, I/Os net negative 2491.  Co-ox 71% this morning on milrinone 0.25.  CVP 6-7.    Outside facilities:  - 4/22 CABG: SVG-D, SVG-OM, LIMA-LAD, SVG RCA - 7/22 Coronary angiography: Totally occluded mid LAD, LCx, and RCA; 80-90% stenosis small D1; patent LIMA-small, diseased LAD, patent SVG-RCA with small PDA and PLV, patent SVG-OM but 90% stenosis in small native vessel at anastomosis, occluded SVG-D.  - 7/22 RHC: mean RA 12, PA 55/19 mean 33, mean PCWP 25, CI 3.12 (on inotrope) - 7/22 echo: EF 25-30%, moderate MR  Objective:   Weight Range: 79.5 kg Body mass index is 31.05 kg/m.   Vital Signs:   Temp:  [97.8 F (36.6 C)-98.5 F (36.9 C)] 98.5 F (36.9 C) (07/31 0729) Pulse Rate:  [67-83] 67 (07/31 0409) Resp:  [18] 18 (07/31 0409) BP: (98-116)/(49-62) 116/62 (07/31 0729) SpO2:  [95 %-99 %] 96 % (07/31 0729) Weight:  [79.5 kg] 79.5 kg (07/31 0409) Last BM Date: 03/14/21  Weight change: Filed Weights   03/14/21 0327 03/15/21 0409  Weight: 81.6 kg 79.5 kg    Intake/Output:   Intake/Output Summary (Last 24 hours) at 03/15/2021 0921 Last data filed at 03/15/2021 2951 Gross per 24 hour  Intake 793.91 ml  Output 2925 ml  Net -2131.09 ml      Physical Exam    General: NAD Neck: No JVD, no thyromegaly or thyroid nodule.  Lungs: Clear to auscultation bilaterally with normal respiratory effort. CV: Nondisplaced PMI.  Heart regular S1/S2, no S3/S4, no murmur.  No peripheral edema.  Abdomen: Soft, nontender, no hepatosplenomegaly, no distention.  Skin: Intact without lesions or rashes.  Neurologic: Alert and oriented x 3.  Psych: Normal affect. Extremities: No clubbing or cyanosis.  HEENT: Normal.    Telemetry   NSR 80s (personally  reviewed)  Labs    CBC Recent Labs    03/14/21 0500  WBC 6.2  NEUTROABS 4.1  HGB 9.0*  HCT 27.6*  MCV 80.5  PLT 129*   Basic Metabolic Panel Recent Labs    88/41/66 0500 03/15/21 0638  NA 134* 137  K 3.7 3.8  CL 97* 99  CO2 25 28  GLUCOSE 195* 155*  BUN 21 16  CREATININE 1.21* 1.22*  CALCIUM 9.1 9.1   Liver Function Tests Recent Labs    03/14/21 0500  AST 24  ALT 20  ALKPHOS 84  BILITOT 0.6  PROT 6.5  ALBUMIN 3.0*   No results for input(s): LIPASE, AMYLASE in the last 72 hours. Cardiac Enzymes No results for input(s): CKTOTAL, CKMB, CKMBINDEX, TROPONINI in the last 72 hours.  BNP: BNP (last 3 results) Recent Labs    03/14/21 0500  BNP 748.0*    ProBNP (last 3 results) No results for input(s): PROBNP in the last 8760 hours.   D-Dimer No results for input(s): DDIMER in the last 72 hours. Hemoglobin A1C Recent Labs    03/14/21 0500  HGBA1C 7.6*   Fasting Lipid Panel Recent Labs    03/14/21 0500  CHOL 64  HDL 36*  LDLCALC 10  TRIG 92  CHOLHDL 1.8   Thyroid Function Tests Recent Labs    03/14/21 0500  TSH 6.021*    Other results:   Imaging  No results found.   Medications:     Scheduled Medications:  aspirin EC  81 mg Oral Daily   atorvastatin  80 mg Oral Daily   busPIRone  5 mg Oral TID   Chlorhexidine Gluconate Cloth  6 each Topical Daily   clopidogrel  75 mg Oral Daily   dapagliflozin propanediol  10 mg Oral Daily   digoxin  0.125 mg Oral Daily   enoxaparin (LOVENOX) injection  40 mg Subcutaneous Q24H   feeding supplement  237 mL Oral BID BM   insulin aspart  0-15 Units Subcutaneous TID WC   insulin aspart  0-5 Units Subcutaneous QHS   losartan  12.5 mg Oral BID   sertraline  50 mg Oral Daily   sodium chloride flush  10-40 mL Intracatheter Q12H   sodium chloride flush  3 mL Intravenous Q12H   spironolactone  25 mg Oral Daily   torsemide  20 mg Oral Daily    Infusions:  sodium chloride     milrinone 0.25  mcg/kg/min (03/15/21 0723)    PRN Medications: sodium chloride, acetaminophen, ondansetron (ZOFRAN) IV, sodium chloride flush, sodium chloride flush   Assessment/Plan   1. CAD: s/p CABG x 4 4/22.  Cath in 7/22 with 3/4 grafts patent, occluded SVG-small D.  Native vessels, however, were small and diseased (diabetic coronaries).  No chest pain.  - Continue ASA 81 - Continue Plavix for now.  - Continue atorvastatin, good LDL.  2. Acute systolic CHF: Ischemic cardiomyopathy.  Echo at [facility] with EF 25-30%, moderate MR.  She was admitted to [facility] with CHF, started on inotropes and diuresed.  RHC showed elevated filling pressures and preserved CO (but was on inotropes at the time).  Unable to wean off inotropes successfully, sent here for consideration of LVAD.  She was on both dobutamine and milrinone at arrival, now off dobutamine and on milrinone 0.25 with co-ox 71%.  CVP 6-7 this morning after good diuresis yesterday on IV Lasix.  She is not volume overloaded now on exam.   - Decrease milrinone to 0.125 daily.  - Farxiga 10 mg daily and spironolactone 25 daily.   - Continue digoxin 0.125 daily.  - Stop IV Lasix, start torsemide 20 mg daily.  - Add losartan 12.5 mg bid, hopefully can eventually get her to [facility] but will have to watch BP carefully.   - Will see how she does with diuresis and then weaning inotropes.  Hopefuly can control with meds, but she appears to be a candidate for LVAD or OHT if needed.  3. Type 2 DM: HgbA1c 7.6.   - SSI, Farxiga for now.  4. Fe deficiency anemia: She had Feraheme.  - FOBT.   Marca Ancona 03/15/2021 9:21 AM   Length of Stay: 1  Marca Ancona, MD  03/15/2021, 9:21 AM  Advanced Heart Failure Team Pager [phone] (M-F; 7a - 5p)  Please contact [facility] for night-coverage after hours (5p -7a ) and weekends on [email or web address]

## 2021-03-16 DIAGNOSIS — I5043 Acute on chronic combined systolic (congestive) and diastolic (congestive) heart failure: Secondary | ICD-10-CM | POA: Diagnosis not present

## 2021-03-16 LAB — COOXEMETRY PANEL
Carboxyhemoglobin: 1.2 % (ref 0.5–1.5)
Methemoglobin: 0.9 % (ref 0.0–1.5)
O2 Saturation: 69.8 %
Total hemoglobin: 9 g/dL — ABNORMAL LOW (ref 12.0–16.0)

## 2021-03-16 LAB — CBC
HCT: 30.1 % — ABNORMAL LOW (ref 36.0–46.0)
Hemoglobin: 9.8 g/dL — ABNORMAL LOW (ref 12.0–15.0)
MCH: 26.8 pg (ref 26.0–34.0)
MCHC: 32.6 g/dL (ref 30.0–36.0)
MCV: 82.2 fL (ref 80.0–100.0)
Platelets: 164 10*3/uL (ref 150–400)
RBC: 3.66 MIL/uL — ABNORMAL LOW (ref 3.87–5.11)
RDW: 15.8 % — ABNORMAL HIGH (ref 11.5–15.5)
WBC: 6.9 10*3/uL (ref 4.0–10.5)
nRBC: 0 % (ref 0.0–0.2)

## 2021-03-16 LAB — GLUCOSE, CAPILLARY
Glucose-Capillary: 160 mg/dL — ABNORMAL HIGH (ref 70–99)
Glucose-Capillary: 171 mg/dL — ABNORMAL HIGH (ref 70–99)
Glucose-Capillary: 170 mg/dL — ABNORMAL HIGH (ref 70–99)
Glucose-Capillary: 221 mg/dL — ABNORMAL HIGH (ref 70–99)

## 2021-03-16 LAB — BASIC METABOLIC PANEL
Anion gap: 8 (ref 5–15)
BUN: 20 mg/dL (ref 8–23)
CO2: 29 mmol/L (ref 22–32)
Calcium: 9 mg/dL (ref 8.9–10.3)
Chloride: 98 mmol/L (ref 98–111)
Creatinine, Ser: 1.2 mg/dL — ABNORMAL HIGH (ref 0.44–1.00)
GFR, Estimated: 51 mL/min — ABNORMAL LOW (ref >60)
Glucose, Bld: 153 mg/dL — ABNORMAL HIGH (ref 70–99)
Potassium: 3.6 mmol/L (ref 3.5–5.1)
Sodium: 135 mmol/L (ref 135–145)

## 2021-03-16 LAB — ABO/RH: ABO/RH(D): O POS

## 2021-03-16 LAB — TYPE AND SCREEN
ABO/RH(D): O POS
Antibody Screen: NEGATIVE

## 2021-03-16 LAB — ANTITHROMBIN III: AntiThromb III Func: 102 % (ref 75–120)

## 2021-03-16 LAB — PREALBUMIN: Prealbumin: 11.3 mg/dL — ABNORMAL LOW (ref 18–38)

## 2021-03-16 LAB — LACTATE DEHYDROGENASE: LDH: 112 U/L (ref 98–192)

## 2021-03-16 MED ORDER — POTASSIUM CHLORIDE CRYS ER 20 MEQ PO TBCR
40.0000 meq | EXTENDED_RELEASE_TABLET | Freq: Once | ORAL | Status: AC
  Administered 2021-03-16: 40 meq via ORAL
  Filled 2021-03-16: qty 2

## 2021-03-16 NOTE — Progress Notes (Signed)
Mobility Specialist: Progress Note   03/16/21 1532  Mobility  Activity Ambulated in hall  Level of Assistance Independent  Assistive Device None  Distance Ambulated (ft) 400 ft  Mobility Ambulated independently in hallway  Mobility Response Tolerated well  Mobility performed by Mobility specialist  $Mobility charge 1 Mobility   Pre-Mobility: 68 HR During Mobility: 91 HR, 99% SpO2 Post-Mobility: 81 HR, 105/59 BP, 100% SpO2  Pt independent with bed mobility as well as during ambulation. Pt stopped for one brief standing breaks d/t feeling SOB, otherwise asx. Pt back to bed after walk with call bell at her side.   [facility] Murry Khiev Mobility Specialist Mobility Specialist Phone: [phone]

## 2021-03-16 NOTE — Progress Notes (Signed)
Pt was seen at the request of Dr. Shirlee Latch for advanced therapy options. Pt was transferred here from [facility] on dual inotropes. On arrival to pts room pt is up in the chair, is a very pleasant lady. She tells me that she is feeling much better today and is grateful for the [place] [facility] team. Pt and I had a lengthy discussion about heart transplant and LVAD. Pt informed that she may need to have some type of advanced therapy in the future. Pt recently had a CABG in April and has had difficulty "keeping the fluid off." Pt tells me that she was working before her heart attack in April. She is on FMLA currrently and has [employer] that she picked out on [employer]. I informed pt that we are hopeful that she will not need the Milrinone but would like to have everything in place to refer her to [facility] for transplant when appropriate. We will have our social worker see her in the heart failure clinic to ensure that her current insurance is appropriate for VAD/transplant. VAD team will order labs, CT scans and PFTs to be completed during this admission. We will follow the pt closely on outpatient basis along with Dr. Shirlee Latch in the heart failure clinic. Pt does not smoke or use illicit drugs. She is currently widowed. I obtained permission from pt to request operative note from CABG at [facility]. VAD team will continue to follow.  Carlton Adam RN, BSN VAD Coordinator 24/7 Pager [phone]

## 2021-03-16 NOTE — Plan of Care (Signed)

## 2021-03-16 NOTE — TOC Benefit Eligibility Note (Signed)
Transition of Care [facility]) Benefit Eligibility Note    Patient Details  Name: Marvel Sapp MRN: [id number] Date of Birth: [birth year]   Medication/Dose: Wilder Glade  10 MG DAILY  Covered?: Yes  Tier: 3 Drug  Prescription Coverage Preferred Pharmacy: [facility] with Person/Company/Phone Number:: STEPHANIE  @ [employer] # [phone]  Co-Pay: Johnsie Kindred  Prior Approval: No  Deductible: [employer] Phone Number: 03/16/2021, 2:31 PM

## 2021-03-16 NOTE — Progress Notes (Signed)
CARDIAC REHAB PHASE I   PRE:  Rate/Rhythm: 75 SR    BP: sitting 93/56    SaO2: 97 RA  MODE:  Ambulation: 480 ft   POST:  Rate/Rhythm: 94 SR    BP: sitting 105/64     SaO2: 93 RA  Pt feeling well. Increased distance, no assist. Rest x1 for fatigue/SOB. Discussed HF booklet, diet, exercise, and CRPII. Pt very receptive. She watched the HF video yesterday. Will refer to [facility]. Has IS. Can walk independently. [id number]   Harriet Masson CES, ACSM 03/16/2021 8:57 AM

## 2021-03-16 NOTE — Progress Notes (Addendum)
Patient ID: Andrea Mueller, female   DOB: 01/08/1956, 65 y.o.   MRN: [id number] P    Advanced Heart Failure Rounding Note  PCP-Cardiologist: None   Subjective:    She had some shortness of breath overnight but feels fine now.  Has been walking in halls.  SBP 90s.  Now on milrinone 0.125, co-ox 70% with CVP reading 2 (not good waveform, not sure accurate).     Echo: EF 25-30% with WMAs, mildly decreased RV systolic function.   Outside facilities:  - 4/22 CABG: SVG-D, SVG-OM, LIMA-LAD, SVG RCA - 7/22 Coronary angiography: Totally occluded mid LAD, LCx, and RCA; 80-90% stenosis small D1; patent LIMA-small, diseased LAD, patent SVG-RCA with small PDA and PLV, patent SVG-OM but 90% stenosis in small native vessel at anastomosis, occluded SVG-D.  - 7/22 RHC: mean RA 12, PA 55/19 mean 33, mean PCWP 25, CI 3.12 (on inotrope) - 7/22 echo: EF 25-30%, moderate MR  Objective:   Weight Range: 79.5 kg Body mass index is 31.05 kg/m.   Vital Signs:   Temp:  [98 F (36.7 C)-98.7 F (37.1 C)] 98.1 F (36.7 C) (08/01 0746) Pulse Rate:  [77-79] 77 (08/01 0746) Resp:  [18] 18 (07/31 1110) BP: (94-101)/(51-58) 98/58 (08/01 0746) SpO2:  [96 %-97 %] 97 % (08/01 0746) Weight:  [79.5 kg] 79.5 kg (08/01 0551) Last BM Date: 03/14/21  Weight change: Filed Weights   03/14/21 0327 03/15/21 0409 03/16/21 0551  Weight: 81.6 kg 79.5 kg 79.5 kg    Intake/Output:   Intake/Output Summary (Last 24 hours) at 03/16/2021 0816 Last data filed at 03/16/2021 0600 Gross per 24 hour  Intake 690.75 ml  Output 1850 ml  Net -1159.25 ml      Physical Exam    General: NAD Neck: No JVD, no thyromegaly or thyroid nodule.  Lungs: Clear to auscultation bilaterally with normal respiratory effort. CV: Nondisplaced PMI.  Heart regular S1/S2, no S3/S4, no murmur.  No peripheral edema.   Abdomen: Soft, nontender, no hepatosplenomegaly, no distention.  Skin: Intact without lesions or rashes.  Neurologic: Alert and  oriented x 3.  Psych: Normal affect. Extremities: No clubbing or cyanosis.  HEENT: Normal.    Telemetry   NSR 80s (personally reviewed)  Labs    CBC Recent Labs    03/14/21 0500 03/16/21 0355  WBC 6.2 6.9  NEUTROABS 4.1  --   HGB 9.0* 9.8*  HCT 27.6* 30.1*  MCV 80.5 82.2  PLT 129* 164   Basic Metabolic Panel Recent Labs    50/93/26 0638 03/16/21 0355  NA 137 135  K 3.8 3.6  CL 99 98  CO2 28 29  GLUCOSE 155* 153*  BUN 16 20  CREATININE 1.22* 1.20*  CALCIUM 9.1 9.0   Liver Function Tests Recent Labs    03/14/21 0500  AST 24  ALT 20  ALKPHOS 84  BILITOT 0.6  PROT 6.5  ALBUMIN 3.0*   No results for input(s): LIPASE, AMYLASE in the last 72 hours. Cardiac Enzymes No results for input(s): CKTOTAL, CKMB, CKMBINDEX, TROPONINI in the last 72 hours.  BNP: BNP (last 3 results) Recent Labs    03/14/21 0500  BNP 748.0*    ProBNP (last 3 results) No results for input(s): PROBNP in the last 8760 hours.   D-Dimer No results for input(s): DDIMER in the last 72 hours. Hemoglobin A1C Recent Labs    03/14/21 0500  HGBA1C 7.6*   Fasting Lipid Panel Recent Labs    03/14/21 0500  CHOL  64  HDL 36*  LDLCALC 10  TRIG 92  CHOLHDL 1.8   Thyroid Function Tests Recent Labs    03/14/21 0500  TSH 6.021*    Other results:   Imaging    ECHOCARDIOGRAM COMPLETE  Result Date: 03/15/2021    ECHOCARDIOGRAM REPORT   Patient Name:   Andrea Mueller Date of Exam: 03/15/2021 Medical Rec #:  [id number]    Height:       63.0 in Accession #:    [id number]   Weight:       175.3 lb Date of Birth:  [birth year]   BSA:          1.828 m Patient Age:    64 years     BP:           101/51 mmHg Patient Gender: F            HR:           70 bpm. Exam Location:  Inpatient Procedure: 2D Echo, Color Doppler, Cardiac Doppler and Intracardiac            Opacification Agent Indications:    I50.21 Acute systolic (congestive) heart failure  History:        Patient has prior history of  Echocardiogram examinations, most                 recent 03/03/2021. Prior CABG; Risk Factors:Diabetes.  Sonographer:    Irving Burton Senior RDCS Referring Phys: [id number] CHRISTOPHER A WROBEL IMPRESSIONS  1. Akinesis of the apical, mid-apical anterior, mid-apical anterolateral, mid-apical inferolateral myocardium. Hypokinesis of the inferior, inferoseptal, wall and apical septum.. Left ventricular ejection fraction, by estimation, is 25 to 30%. The left ventricle has severely decreased function. The left ventricle demonstrates regional wall motion abnormalities (see scoring diagram/findings for description). Left ventricular diastolic parameters are consistent with Grade II diastolic dysfunction (pseudonormalization). Elevated left ventricular end-diastolic pressure.  2. Right ventricular systolic function is mildly reduced. The right ventricular size is normal. There is normal pulmonary artery systolic pressure.  3. Left atrial size was moderately dilated.  4. The mitral valve is normal in structure. Trivial mitral valve regurgitation. No evidence of mitral stenosis.  5. The aortic valve is tricuspid. There is mild calcification of the aortic valve. Aortic valve regurgitation is not visualized. No aortic stenosis is present.  6. The inferior vena cava is normal in size with <50% respiratory variability, suggesting right atrial pressure of 8 mmHg. FINDINGS  Left Ventricle: Akinesis of the apical, mid-apical anterior, mid-apical anterolateral, mid-apical inferolateral myocardium. Hypokinesis of the inferior, inferoseptal, wall and apical septum. Left ventricular ejection fraction, by estimation, is 25 to 30%. The left ventricle has severely decreased function. The left ventricle demonstrates regional wall motion abnormalities. Definity contrast agent was given IV to delineate the left ventricular endocardial borders. The left ventricular internal cavity size was normal in size. There is no left ventricular hypertrophy. Left  ventricular diastolic parameters are consistent with Grade II diastolic dysfunction (pseudonormalization). Elevated left ventricular end-diastolic pressure. Right Ventricle: The right ventricular size is normal. No increase in right ventricular wall thickness. Right ventricular systolic function is mildly reduced. There is normal pulmonary artery systolic pressure. The tricuspid regurgitant velocity is 1.48 m/s, and with an assumed right atrial pressure of 8 mmHg, the estimated right ventricular systolic pressure is 16.8 mmHg. Left Atrium: Left atrial size was moderately dilated. Right Atrium: Right atrial size was normal in size. Pericardium: There is no evidence of pericardial effusion. Mitral Valve: The  mitral valve is normal in structure. Trivial mitral valve regurgitation. No evidence of mitral valve stenosis. Tricuspid Valve: The tricuspid valve is normal in structure. Tricuspid valve regurgitation is trivial. No evidence of tricuspid stenosis. Aortic Valve: The aortic valve is tricuspid. There is mild calcification of the aortic valve. Aortic valve regurgitation is not visualized. No aortic stenosis is present. Pulmonic Valve: The pulmonic valve was normal in structure. Pulmonic valve regurgitation is not visualized. No evidence of pulmonic stenosis. Aorta: The aortic root is normal in size and structure. Venous: The inferior vena cava is normal in size with less than 50% respiratory variability, suggesting right atrial pressure of 8 mmHg. IAS/Shunts: No atrial level shunt detected by color flow Doppler.  LEFT VENTRICLE PLAX 2D LVIDd:         5.00 cm      Diastology LVIDs:         3.50 cm      LV e' medial:    5.22 cm/s LV PW:         0.70 cm      LV E/e' medial:  27.6 LV IVS:        0.60 cm      LV e' lateral:   9.14 cm/s LVOT diam:     2.00 cm      LV E/e' lateral: 15.8 LV SV:         47 LV SV Index:   26 LVOT Area:     3.14 cm  LV Volumes (MOD) LV vol d, MOD A2C: 107.0 ml LV vol d, MOD A4C: 131.0 ml LV  vol s, MOD A2C: 73.4 ml LV vol s, MOD A4C: 73.1 ml LV SV MOD A2C:     33.6 ml LV SV MOD A4C:     131.0 ml LV SV MOD BP:      45.1 ml RIGHT VENTRICLE RV S prime:     8.49 cm/s TAPSE (M-mode): 1.8 cm LEFT ATRIUM             Index       RIGHT ATRIUM           Index LA diam:        4.20 cm 2.30 cm/m  RA Area:     18.80 cm LA Vol (A2C):   57.5 ml 31.45 ml/m RA Volume:   49.90 ml  27.30 ml/m LA Vol (A4C):   62.4 ml 34.13 ml/m LA Biplane Vol: 62.1 ml 33.97 ml/m  AORTIC VALVE LVOT Vmax:   78.30 cm/s LVOT Vmean:  54.600 cm/s LVOT VTI:    0.151 m  AORTA Ao Root diam: 2.80 cm Ao Asc diam:  2.80 cm MITRAL VALVE                TRICUSPID VALVE MV Area (PHT): 3.31 cm     TR Peak grad:   8.8 mmHg MV Decel Time: 229 msec     TR Vmax:        148.00 cm/s MV E velocity: 144.00 cm/s                             SHUNTS                             Systemic VTI:  0.15 m  Systemic Diam: 2.00 cm Chilton Si MD Electronically signed by Chilton Si MD Signature Date/Time: 03/15/2021/1:33:00 PM    Final      Medications:     Scheduled Medications:  aspirin EC  81 mg Oral Daily   atorvastatin  80 mg Oral Daily   busPIRone  5 mg Oral TID   Chlorhexidine Gluconate Cloth  6 each Topical Daily   clopidogrel  75 mg Oral Daily   dapagliflozin propanediol  10 mg Oral Daily   digoxin  0.125 mg Oral Daily   enoxaparin (LOVENOX) injection  40 mg Subcutaneous Q24H   feeding supplement  237 mL Oral BID BM   insulin aspart  0-15 Units Subcutaneous TID WC   insulin aspart  0-5 Units Subcutaneous QHS   losartan  12.5 mg Oral BID   multivitamin with minerals  1 tablet Oral Daily   sertraline  50 mg Oral Daily   sodium chloride flush  10-40 mL Intracatheter Q12H   sodium chloride flush  3 mL Intravenous Q12H   spironolactone  25 mg Oral Daily   torsemide  20 mg Oral Daily    Infusions:  sodium chloride     milrinone 0.125 mcg/kg/min (03/16/21 0400)    PRN Medications: sodium chloride,  acetaminophen, ondansetron (ZOFRAN) IV, sodium chloride flush, sodium chloride flush   Assessment/Plan   1. CAD: s/p CABG x 4 4/22.  Cath in 7/22 with 3/4 grafts patent, occluded SVG-small D.  Native vessels, however, were small and diseased (diabetic coronaries).  No chest pain.  - Continue ASA 81 - Continue Plavix for now.  - Continue atorvastatin, good LDL.  2. Acute systolic CHF: Ischemic cardiomyopathy.  Echo at [facility] with EF 25-30%, moderate MR.  She was admitted to [facility] with CHF, started on inotropes and diuresed.  RHC showed elevated filling pressures and preserved CO (but was on inotropes at the time).  Unable to wean off inotropes successfully, sent here for consideration of LVAD.  She was on both dobutamine and milrinone at arrival, now off dobutamine and on milrinone 0.125 with co-ox 70%.  CVP 2 this morning (poor waveform so not sure of accuracy).  She is not volume overloaded now on exam.  SBP 90s but no lightheadedness.  - Stop milrinone.  - Farxiga 10 mg daily and spironolactone 25 daily.   - Continue digoxin 0.125 daily.  - Continue torsemide 20 mg daily.  - Continue losartan 12.5 mg bid, no BP room for Entresto.    - Follow off milrinone.  Hopefuly can control with meds, but she appears to be a candidate for LVAD or OHT if needed.  3. Type 2 DM: HgbA1c 7.6.   - SSI, Farxiga for now.  4. Fe deficiency anemia: She had Feraheme.  - FOBT.   Marca Ancona 03/16/2021 8:16 AM   Length of Stay: 2  Marca Ancona, MD  03/16/2021, 8:16 AM  Advanced Heart Failure Team Pager [phone] (M-F; 7a - 5p)  Please contact [facility] for night-coverage after hours (5p -7a ) and weekends on [email or web address]

## 2021-03-16 NOTE — TOC CM/SW Note (Signed)
TOC CM sent Andrea Mueller for benefits check.Isidoro Donning RN CCM, [facility] ED TOC CM [phone]

## 2021-03-17 ENCOUNTER — Inpatient Hospital Stay (HOSPITAL_COMMUNITY): Payer: 59

## 2021-03-17 LAB — BASIC METABOLIC PANEL
Anion gap: 11 (ref 5–15)
BUN: 21 mg/dL (ref 8–23)
CO2: 28 mmol/L (ref 22–32)
Calcium: 9.6 mg/dL (ref 8.9–10.3)
Chloride: 94 mmol/L — ABNORMAL LOW (ref 98–111)
Creatinine, Ser: 1.15 mg/dL — ABNORMAL HIGH (ref 0.44–1.00)
GFR, Estimated: 54 mL/min — ABNORMAL LOW (ref >60)
Glucose, Bld: 144 mg/dL — ABNORMAL HIGH (ref 70–99)
Potassium: 4.4 mmol/L (ref 3.5–5.1)
Sodium: 133 mmol/L — ABNORMAL LOW (ref 135–145)

## 2021-03-17 LAB — CBC
HCT: 31.5 % — ABNORMAL LOW (ref 36.0–46.0)
Hemoglobin: 10.1 g/dL — ABNORMAL LOW (ref 12.0–15.0)
MCH: 26.1 pg (ref 26.0–34.0)
MCHC: 32.1 g/dL (ref 30.0–36.0)
MCV: 81.4 fL (ref 80.0–100.0)
Platelets: 197 10*3/uL (ref 150–400)
RBC: 3.87 MIL/uL (ref 3.87–5.11)
RDW: 15.8 % — ABNORMAL HIGH (ref 11.5–15.5)
WBC: 7.9 10*3/uL (ref 4.0–10.5)
nRBC: 0 % (ref 0.0–0.2)

## 2021-03-17 LAB — COOXEMETRY PANEL
Carboxyhemoglobin: 0.8 % (ref 0.5–1.5)
Methemoglobin: 1 % (ref 0.0–1.5)
O2 Saturation: 59.3 %
Total hemoglobin: 14.5 g/dL (ref 12.0–16.0)

## 2021-03-17 LAB — GLUCOSE, CAPILLARY
Glucose-Capillary: 158 mg/dL — ABNORMAL HIGH (ref 70–99)
Glucose-Capillary: 144 mg/dL — ABNORMAL HIGH (ref 70–99)
Glucose-Capillary: 158 mg/dL — ABNORMAL HIGH (ref 70–99)
Glucose-Capillary: 213 mg/dL — ABNORMAL HIGH (ref 70–99)

## 2021-03-17 MED ORDER — IOHEXOL 9 MG/ML PO SOLN
ORAL | Status: AC
  Administered 2021-03-17: 500 mL
  Filled 2021-03-17: qty 1000

## 2021-03-17 MED ORDER — SODIUM CHLORIDE 0.9 % IV SOLN
510.0000 mg | Freq: Once | INTRAVENOUS | Status: AC
  Administered 2021-03-17: 510 mg via INTRAVENOUS
  Filled 2021-03-17: qty 17

## 2021-03-17 MED ORDER — TORSEMIDE 20 MG PO TABS
40.0000 mg | ORAL_TABLET | Freq: Every day | ORAL | Status: DC
  Administered 2021-03-17: 40 mg via ORAL
  Filled 2021-03-17: qty 2

## 2021-03-17 NOTE — TOC Initial Note (Addendum)
Transition of Care [facility]) - Initial/Assessment Note    Patient Details  Name: Andrea Mueller MRN: [id number] Date of Birth: August 20, 1955  [facility]) CM/SW Contact:    Erenest Rasher, RN Phone Number: 03/17/2021, 10:35 AM  Clinical Narrative:                  HF TOC CM spoke to pt at bedside. States she pays out of pocket for her meds at [place]. Will let pt know to use her insurance plan as her meds should be covered under insurance. Lives at home with dtr, Andrea Mueller. Has Rollator, and shower chair.  Will continue to follow for dc needs. Made pt aware of benefits for Farxiga. Explained she should have her meds sent through insurance. Gave permission to speak to dtr Andrea Mueller or son, Andrea Mueller.    Medication/Dose: Wilder Glade  10 MG DAILY   Covered?: Yes   Tier: 3 Drug   Prescription Coverage Preferred Pharmacy: [facility] with Person/Company/Phone Number:: STEPHANIE  @ [employer] # [phone]   Co-Pay: ZERO DOLLARS   Prior Approval: No   Deductible: Unmet (OUT-OF-POCKET-MET)    Expected Discharge Plan: Home/Self Care Barriers to Discharge: Continued Medical Work up   Patient Goals and CMS Choice Patient states their goals for this hospitalization and ongoing recovery are:: just completed [facility] PT CMS Medicare.gov Compare Post Acute Care list provided to:: Patient    Expected Discharge Plan and Services Expected Discharge Plan: Home/Self Care In-house Referral: Clinical Social Work Discharge Planning Services: CM Consult Post Acute Care Choice: [place] arrangements for the past 2 months: [place]                                      Prior Living Arrangements/Services Living arrangements for the past 2 months: Single Family Home   Patient language and need for interpreter reviewed:: Yes Do you feel safe going back to the place where you live?: Yes      Need for Family Participation in Patient Care: No (Comment) Care  giver support system in place?: No (comment)   Criminal Activity/Legal Involvement Pertinent to Current Situation/Hospitalization: No - Comment as needed  Activities of Daily Living Home Assistive Devices/Equipment: Walker (specify type) ADL Screening (condition at time of admission) Patient's cognitive ability adequate to safely complete daily activities?: Yes Is the patient deaf or have difficulty hearing?: No Does the patient have difficulty seeing, even when wearing glasses/contacts?: No Does the patient have difficulty concentrating, remembering, or making decisions?: No Patient able to express need for assistance with ADLs?: Yes Does the patient have difficulty dressing or bathing?: No Independently performs ADLs?: Yes (appropriate for developmental age) Does the patient have difficulty walking or climbing stairs?: Yes Weakness of Legs: Both Weakness of Arms/Hands: None  Permission Sought/Granted Permission sought to share information with : Case Manager, PCP, Family Supports Permission granted to share information with : Yes, Verbal Permission Granted  Share Information with NAME: Andrea Mueller     Permission granted to share info w Relationship: daughter     Emotional Assessment Appearance:: Appears stated age   Affect (typically observed): Calm, Hopeful Orientation: : Oriented to Self, Oriented to Place, Oriented to  Time, Oriented to Situation   Psych Involvement: No (comment)  Admission diagnosis:  Acute on chronic systolic and diastolic heart failure, NYHA class 3 ([place]) [I50.43] Patient Active Problem List  Diagnosis Date Noted   Acute on chronic systolic and diastolic heart failure, NYHA class 3 ([place]) 03/14/2021   PCP:  Aline Brochure, PA-C Pharmacy:   [facility] [place], [place] [address] [place] [address] Phone: [phone] Fax: [phone]     Social Determinants of Health (SDOH)  Interventions    Readmission Risk Interventions No flowsheet data found.

## 2021-03-17 NOTE — Plan of Care (Signed)

## 2021-03-17 NOTE — Hospital Course (Signed)
Acute Systolic Heart Failure CAD DMII Fe Deficiency Anemia   Andrea Mueller is a 65 year old with h/o CAD, S/P CABG x4 11/2020, DMII, iron deficient anemia.   Had CABG in April.    Outside facilities: - 4/22 CABG: SVG-D, SVG-OM, LIMA-LAD, SVG RCA - 7/22 Coronary angiography: Totally occluded mid LAD, LCx, and RCA; 80-90% stenosis small D1; patent LIMA-small, diseased LAD, patent SVG-RCA with small PDA and PLV, patent SVG-OM but 90% stenosis in small native vessel at anastomosis, occluded SVG-D. - 7/22 RHC: mean RA 12, PA 55/19 mean 33, mean PCWP 25, CI 3.12 (on inotrope) - 7/22 echo: EF 25-30%, moderate MR   1. CAD: s/p CABG x 4 4/22.  Cath in 7/22 with 3/4 grafts patent, occluded SVG-small D.  Native vessels, however, were small and diseased (diabetic coronaries).  No chest pain.   - Continue ASA 81 - Continue Plavix for now. - Continue atorvastatin, good LDL. 2. Acute systolic CHF: Ischemic cardiomyopathy.  Echo at [facility] with EF 25-30%, moderate MR.  She was admitted to [facility] with CHF, started on inotropes and diuresed.  RHC showed elevated filling pressures and preserved CO (but was on inotropes at the time).  Unable to wean off inotropes successfully, sent here for consideration of LVAD.  She was on both dobutamine and milrinone at arrival, now off dobutamine and milrinone 0.125 - CO-OX 59%. Fluid trending up. Continue torsemide. Check CVP.  - Farxiga 10 mg daily and spironolactone 25 daily.   - Continue digoxin 0.125 daily. - Continue torsemide 20 mg daily. - Continue losartan 12.5 mg bid, no BP room for Entresto.    -  Hopefuly can control with meds, but she appears to be a candidate for LVAD or OHT if needed. Blood Type O+ -Check BMET now. 3. Type 2 DM: HgbA1c 7.6.   - SSI, Farxiga for now. 4. Fe deficiency anemia: She had Feraheme and will get 2nd dose today.  - FOBT.

## 2021-03-17 NOTE — Progress Notes (Signed)
Mobility Specialist: Progress Note   03/17/21 1435  Mobility  Activity Ambulated in hall  Level of Assistance Independent  Assistive Device None  Distance Ambulated (ft) 400 ft  Mobility Ambulated independently in hallway  Mobility Response Tolerated well  Mobility performed by Mobility specialist  $Mobility charge 1 Mobility   Pre-Mobility: 65 HR During Mobility: 83 HR, 98% SpO2 Post-Mobility: 81 HR, 107/56 BP, 98% SpO2  Pt stopped for several brief standing breaks d/t feeling "a little winded." Pt sats >90% throughout ambulation. Pt is sitting EOB after walk with call bell at her side.   [facility] Sydna Brodowski Mobility Specialist Mobility Specialist Phone: [phone]

## 2021-03-17 NOTE — Progress Notes (Signed)
CARDIAC REHAB PHASE I   PRE:  Rate/Rhythm: 73 SR    BP: sitting 102/59    SaO2: 98 RA  MODE:  Ambulation: 340 ft   POST:  Rate/Rhythm: 78 SR    BP: sitting 101/55     SaO2: 98 RA  Pt has been drinking contrast. Able to walk independently however more tired and SOB today. Rest x2. VSS.  [id number]   Harriet Masson CES, ACSM 03/17/2021 11:03 AM

## 2021-03-17 NOTE — Progress Notes (Addendum)
Patient ID: Andrea Mueller, female   DOB: 11/23/1955, 65 y.o.   MRN: [id number] P    Advanced Heart Failure Rounding Note  PCP-Cardiologist: None   Subjective:    Off milrinone. CO-OX 59%.   Feels ok. Complaining of mild dyspnea.     Echo: EF 25-30% with WMAs, mildly decreased RV systolic function.   Outside facilities:  - 4/22 CABG: SVG-D, SVG-OM, LIMA-LAD, SVG RCA - 7/22 Coronary angiography: Totally occluded mid LAD, LCx, and RCA; 80-90% stenosis small D1; patent LIMA-small, diseased LAD, patent SVG-RCA with small PDA and PLV, patent SVG-OM but 90% stenosis in small native vessel at anastomosis, occluded SVG-D.  - 7/22 RHC: mean RA 12, PA 55/19 mean 33, mean PCWP 25, CI 3.12 (on inotrope) - 7/22 echo: EF 25-30%, moderate MR  Objective:   Weight Range: 79.2 kg Body mass index is 30.93 kg/m.   Vital Signs:   Temp:  [97.9 F (36.6 C)-98.4 F (36.9 C)] 98 F (36.7 C) (08/02 0809) Pulse Rate:  [68-84] 84 (08/02 0357) Resp:  [14-18] 18 (08/02 0357) BP: (88-107)/(50-66) 98/59 (08/02 0809) SpO2:  [94 %-97 %] 95 % (08/02 0357) Weight:  [79.2 kg] 79.2 kg (08/02 0357) Last BM Date: 03/16/21  Weight change: Filed Weights   03/15/21 0409 03/16/21 0551 03/17/21 0357  Weight: 79.5 kg 79.5 kg 79.2 kg    Intake/Output:   Intake/Output Summary (Last 24 hours) at 03/17/2021 0831 Last data filed at 03/17/2021 0403 Gross per 24 hour  Intake 806 ml  Output 1700 ml  Net -894 ml      Physical Exam    General:  Well appearing. No resp difficulty HEENT: normal Neck: supple. JVP 7-8  Carotids 2+ bilat; no bruits. No lymphadenopathy or thryomegaly appreciated. Cor: PMI nondisplaced. Regular rate & rhythm. No rubs, gallops or murmurs. Lungs: clear Abdomen: soft, nontender, nondistended. No hepatosplenomegaly. No bruits or masses. Good bowel sounds. Extremities: no cyanosis, clubbing, rash, R and LLE 1+ edema. RUE pICC  Neuro: alert & orientedx3, cranial nerves grossly intact. moves  all 4 extremities w/o difficulty. Affect pleasant  Telemetry   SR 60-70s  Labs    CBC Recent Labs    03/16/21 0355  WBC 6.9  HGB 9.8*  HCT 30.1*  MCV 82.2  PLT 164   Basic Metabolic Panel Recent Labs    17/49/44 0638 03/16/21 0355  NA 137 135  K 3.8 3.6  CL 99 98  CO2 28 29  GLUCOSE 155* 153*  BUN 16 20  CREATININE 1.22* 1.20*  CALCIUM 9.1 9.0   Liver Function Tests No results for input(s): AST, ALT, ALKPHOS, BILITOT, PROT, ALBUMIN in the last 72 hours.  No results for input(s): LIPASE, AMYLASE in the last 72 hours. Cardiac Enzymes No results for input(s): CKTOTAL, CKMB, CKMBINDEX, TROPONINI in the last 72 hours.  BNP: BNP (last 3 results) Recent Labs    03/14/21 0500  BNP 748.0*    ProBNP (last 3 results) No results for input(s): PROBNP in the last 8760 hours.   D-Dimer No results for input(s): DDIMER in the last 72 hours. Hemoglobin A1C No results for input(s): HGBA1C in the last 72 hours.  Fasting Lipid Panel No results for input(s): CHOL, HDL, LDLCALC, TRIG, CHOLHDL, LDLDIRECT in the last 72 hours.  Thyroid Function Tests No results for input(s): TSH, T4TOTAL, T3FREE, THYROIDAB in the last 72 hours.  Invalid input(s): FREET3   Other results:   Imaging    No results found.   Medications:  Scheduled Medications:  aspirin EC  81 mg Oral Daily   atorvastatin  80 mg Oral Daily   busPIRone  5 mg Oral TID   Chlorhexidine Gluconate Cloth  6 each Topical Daily   clopidogrel  75 mg Oral Daily   dapagliflozin propanediol  10 mg Oral Daily   digoxin  0.125 mg Oral Daily   enoxaparin (LOVENOX) injection  40 mg Subcutaneous Q24H   feeding supplement  237 mL Oral BID BM   insulin aspart  0-15 Units Subcutaneous TID WC   insulin aspart  0-5 Units Subcutaneous QHS   losartan  12.5 mg Oral BID   multivitamin with minerals  1 tablet Oral Daily   sertraline  50 mg Oral Daily   sodium chloride flush  10-40 mL Intracatheter Q12H   sodium  chloride flush  3 mL Intravenous Q12H   spironolactone  25 mg Oral Daily   torsemide  20 mg Oral Daily    Infusions:  sodium chloride      PRN Medications: sodium chloride, acetaminophen, ondansetron (ZOFRAN) IV, sodium chloride flush, sodium chloride flush   Assessment/Plan   1. CAD: s/p CABG x 4 4/22.  Cath in 7/22 with 3/4 grafts patent, occluded SVG-small D.  Native vessels, however, were small and diseased (diabetic coronaries).  No chest pain.   - Continue ASA 81 - Continue Plavix for now.  - Continue atorvastatin, good LDL.  2. Acute systolic CHF: Ischemic cardiomyopathy.  Echo at [facility] with EF 25-30%, moderate MR.  She was admitted to [facility] with CHF, started on inotropes and diuresed.  RHC showed elevated filling pressures and preserved CO (but was on inotropes at the time).  Unable to wean off inotropes successfully, sent here for consideration of LVAD.  She was on both dobutamine and milrinone at arrival, now off dobutamine and milrinone 0.125 - CO-OX 59%. Fluid trending up. Continue torsemide. Check CVP.  - Farxiga 10 mg daily and spironolactone 25 daily.   - Continue digoxin 0.125 daily.  - Continue torsemide 20 mg daily.  - Continue losartan 12.5 mg bid, no BP room for Entresto.    -  Hopefuly can control with meds, but she appears to be a candidate for LVAD or OHT if needed. Blood Type O+  -Check BMET now. 3. Type 2 DM: HgbA1c 7.6.   - SSI, Farxiga for now.  4. Fe deficiency anemia: She had Feraheme and will get 2nd dose today.  - FOBT.   Consult cardiac rehab.  Amy Clegg NP-C  03/17/2021 8:31 AM   Length of Mueller: 3  Amy Clegg, NP  03/17/2021, 8:31 AM  Advanced Heart Failure Team Pager [phone] (M-F; 7a - 5p)  Please contact [facility] for night-coverage after hours (5p -7a ) and weekends on [email or web address]   Patient seen with NP, agree with the above note.   Co-ox 59% off milrinone.  CVP 5 this morning.  She does have some swelling in her feet after  drinking oral contrast for CT. A little more tired walking with cardiac rehab today.   General: NAD Neck: No JVD, no thyromegaly or thyroid nodule.  Lungs: Clear to auscultation bilaterally with normal respiratory effort. CV: Nondisplaced PMI.  Heart regular S1/S2, no S3/S4, no murmur.  1+ edema in feet.   Abdomen: Soft, nontender, no hepatosplenomegaly, no distention.  Skin: Intact without lesions or rashes.  Neurologic: Alert and oriented x 3.  Psych: Normal affect. Extremities: No clubbing or cyanosis.  HEENT: Normal.   CVP  not elevated (good waveform), will start torsemide 40 mg daily (probably will be her home regimen).   Co-ox acceptable, but more fatigued today.  Will watch one more day off milrinone.  No BP room to titrate up meds, continue current regimen.    Getting workup while here for LVAD, though if she needs advanced therapies hopefully would be able to make it to transplant.  Will need to follow her closely in HF clinic.   Marca Ancona 03/17/2021 11:05 AM

## 2021-03-18 ENCOUNTER — Other Ambulatory Visit (HOSPITAL_COMMUNITY): Payer: Self-pay | Admitting: Respiratory Therapy

## 2021-03-18 ENCOUNTER — Inpatient Hospital Stay (HOSPITAL_COMMUNITY): Payer: 59

## 2021-03-18 ENCOUNTER — Other Ambulatory Visit (HOSPITAL_COMMUNITY): Payer: Self-pay

## 2021-03-18 LAB — DIGOXIN LEVEL: Digoxin Level: 0.7 ng/mL — ABNORMAL LOW (ref 0.8–2.0)

## 2021-03-18 LAB — PULMONARY FUNCTION TEST
DL/VA % pred: 67 %
DL/VA: 2.83 ml/min/mmHg/L
DLCO unc % pred: 62 %
DLCO unc: 12.02 ml/min/mmHg
FEF 25-75 Pre: 1.46 L/sec
FEF2575-%Pred-Pre: 67 %
FEV1-%Pred-Pre: 80 %
FEV1-Pre: 1.92 L
FEV1FVC-%Pred-Pre: 91 %
FEV6-%Pred-Pre: 90 %
FEV6-Pre: 2.7 L
FEV6FVC-%Pred-Pre: 103 %
FVC-%Pred-Pre: 87 %
FVC-Pre: 2.7 L
Pre FEV1/FVC ratio: 71 %
Pre FEV6/FVC Ratio: 100 %
RV % pred: 116 %
RV: 2.33 L
TLC % pred: 99 %
TLC: 4.88 L

## 2021-03-18 LAB — BASIC METABOLIC PANEL
Anion gap: 10 (ref 5–15)
BUN: 24 mg/dL — ABNORMAL HIGH (ref 8–23)
CO2: 31 mmol/L (ref 22–32)
Calcium: 9.6 mg/dL (ref 8.9–10.3)
Chloride: 94 mmol/L — ABNORMAL LOW (ref 98–111)
Creatinine, Ser: 1.27 mg/dL — ABNORMAL HIGH (ref 0.44–1.00)
GFR, Estimated: 48 mL/min — ABNORMAL LOW (ref >60)
Glucose, Bld: 184 mg/dL — ABNORMAL HIGH (ref 70–99)
Potassium: 4.2 mmol/L (ref 3.5–5.1)
Sodium: 135 mmol/L (ref 135–145)

## 2021-03-18 LAB — CBC
HCT: 32.8 % — ABNORMAL LOW (ref 36.0–46.0)
Hemoglobin: 10.3 g/dL — ABNORMAL LOW (ref 12.0–15.0)
MCH: 26.2 pg (ref 26.0–34.0)
MCHC: 31.4 g/dL (ref 30.0–36.0)
MCV: 83.5 fL (ref 80.0–100.0)
Platelets: 187 10*3/uL (ref 150–400)
RBC: 3.93 MIL/uL (ref 3.87–5.11)
RDW: 15.9 % — ABNORMAL HIGH (ref 11.5–15.5)
WBC: 7.6 10*3/uL (ref 4.0–10.5)
nRBC: 0 % (ref 0.0–0.2)

## 2021-03-18 LAB — COOXEMETRY PANEL
Carboxyhemoglobin: 1.1 % (ref 0.5–1.5)
Methemoglobin: 1.4 % (ref 0.0–1.5)
O2 Saturation: 61.2 %
Total hemoglobin: 10.7 g/dL — ABNORMAL LOW (ref 12.0–16.0)

## 2021-03-18 LAB — GLUCOSE, CAPILLARY
Glucose-Capillary: 173 mg/dL — ABNORMAL HIGH (ref 70–99)
Glucose-Capillary: 191 mg/dL — ABNORMAL HIGH (ref 70–99)

## 2021-03-18 MED ORDER — SERTRALINE HCL 50 MG PO TABS
50.0000 mg | ORAL_TABLET | Freq: Every day | ORAL | 6 refills | Status: DC
  Filled 2021-03-18: qty 30, 30d supply, fill #0

## 2021-03-18 MED ORDER — LOSARTAN POTASSIUM 25 MG PO TABS: 12.5000 mg | ORAL_TABLET | Freq: Every day | ORAL | Status: DC

## 2021-03-18 MED ORDER — LOSARTAN POTASSIUM 25 MG PO TABS: 25.0000 mg | ORAL_TABLET | Freq: Every day | ORAL | Status: DC

## 2021-03-18 MED ORDER — SPIRONOLACTONE 25 MG PO TABS
25.0000 mg | ORAL_TABLET | Freq: Every day | ORAL | 6 refills | Status: DC
  Filled 2021-03-18: qty 30, 30d supply, fill #0

## 2021-03-18 MED ORDER — TORSEMIDE 20 MG PO TABS
20.0000 mg | ORAL_TABLET | Freq: Every day | ORAL | Status: DC
  Administered 2021-03-18: 20 mg via ORAL
  Filled 2021-03-18: qty 1

## 2021-03-18 MED ORDER — TORSEMIDE 20 MG PO TABS
20.0000 mg | ORAL_TABLET | Freq: Every day | ORAL | 6 refills | Status: DC
  Filled 2021-03-18: qty 30, 30d supply, fill #0

## 2021-03-18 MED ORDER — DIGOXIN 125 MCG PO TABS
0.0625 mg | ORAL_TABLET | Freq: Every day | ORAL | 6 refills | Status: DC
  Filled 2021-03-18: qty 15, 30d supply, fill #0

## 2021-03-18 MED ORDER — ATORVASTATIN CALCIUM 80 MG PO TABS
80.0000 mg | ORAL_TABLET | Freq: Every day | ORAL | 6 refills | Status: DC
  Filled 2021-03-18: qty 30, 30d supply, fill #0

## 2021-03-18 MED ORDER — LOSARTAN POTASSIUM 25 MG PO TABS
25.0000 mg | ORAL_TABLET | Freq: Every day | ORAL | 6 refills | Status: DC
  Filled 2021-03-18: qty 30, 30d supply, fill #0

## 2021-03-18 MED ORDER — DAPAGLIFLOZIN PROPANEDIOL 10 MG PO TABS
10.0000 mg | ORAL_TABLET | Freq: Every day | ORAL | 6 refills | Status: DC
  Filled 2021-03-18: qty 30, 30d supply, fill #0

## 2021-03-18 MED ORDER — DIGOXIN 125 MCG PO TABS
0.0625 mg | ORAL_TABLET | Freq: Every day | ORAL | Status: DC
  Administered 2021-03-18: 0.0625 mg via ORAL
  Filled 2021-03-18: qty 1

## 2021-03-18 MED ORDER — ASPIRIN 81 MG PO TBEC
81.0000 mg | DELAYED_RELEASE_TABLET | Freq: Every day | ORAL | 11 refills | Status: AC
  Filled 2021-03-18: qty 30, 30d supply, fill #0

## 2021-03-18 MED ORDER — BUSPIRONE HCL 5 MG PO TABS
5.0000 mg | ORAL_TABLET | Freq: Three times a day (TID) | ORAL | 6 refills | Status: DC
  Filled 2021-03-18: qty 90, 30d supply, fill #0

## 2021-03-18 NOTE — Progress Notes (Addendum)
Patient ID: Andrea Mueller, female   DOB: 03/23/1956, 65 y.o.   MRN: [id number] P    Advanced Heart Failure Rounding Note  PCP-Cardiologist: None   Subjective:    Off milrinone. CO-OX 61%.    Over night losartan was held due to soft BP.   Feeling better today. Denies SOB.   Echo: EF 25-30% with WMAs, mildly decreased RV systolic function.   Outside facilities:  - 4/22 CABG: SVG-D, SVG-OM, LIMA-LAD, SVG RCA - 7/22 Coronary angiography: Totally occluded mid LAD, LCx, and RCA; 80-90% stenosis small D1; patent LIMA-small, diseased LAD, patent SVG-RCA with small PDA and PLV, patent SVG-OM but 90% stenosis in small native vessel at anastomosis, occluded SVG-D.  - 7/22 RHC: mean RA 12, PA 55/19 mean 33, mean PCWP 25, CI 3.12 (on inotrope) - 7/22 echo: EF 25-30%, moderate MR  Objective:   Weight Range: 78.3 kg Body mass index is 30.58 kg/m.   Vital Signs:   Temp:  [98.1 F (36.7 C)-98.6 F (37 C)] 98.3 F (36.8 C) (08/03 0750) Pulse Rate:  [64-67] 66 (08/03 0750) Resp:  [14-18] 14 (08/03 0750) BP: (80-98)/(40-55) 98/52 (08/03 0750) SpO2:  [93 %-96 %] 96 % (08/03 0750) Weight:  [78.3 kg] 78.3 kg (08/03 0400) Last BM Date: 03/16/21  Weight change: Filed Weights   03/16/21 0551 03/17/21 0357 03/18/21 0400  Weight: 79.5 kg 79.2 kg 78.3 kg    Intake/Output:   Intake/Output Summary (Last 24 hours) at 03/18/2021 0809 Last data filed at 03/18/2021 0435 Gross per 24 hour  Intake 3 ml  Output 2200 ml  Net -2197 ml      Physical Exam   CVP 2-3  General:  Well appearing. No resp difficulty HEENT: normal Neck: supple. no JVD. Carotids 2+ bilat; no bruits. No lymphadenopathy or thryomegaly appreciated. Cor: PMI nondisplaced. Regular rate & rhythm. No rubs, gallops or murmurs. Lungs: clear Abdomen: soft, nontender, nondistended. No hepatosplenomegaly. No bruits or masses. Good bowel sounds. Extremities: no cyanosis, clubbing, rash, edema. RUE PICC  Neuro: alert & orientedx3,  cranial nerves grossly intact. moves all 4 extremities w/o difficulty. Affect pleasant  Telemetry  NSr 60-70s   Labs    CBC Recent Labs    03/17/21 1113 03/18/21 0424  WBC 7.9 7.6  HGB 10.1* 10.3*  HCT 31.5* 32.8*  MCV 81.4 83.5  PLT 197 187   Basic Metabolic Panel Recent Labs    35/00/93 1113 03/18/21 0424  NA 133* 135  K 4.4 4.2  CL 94* 94*  CO2 28 31  GLUCOSE 144* 184*  BUN 21 24*  CREATININE 1.15* 1.27*  CALCIUM 9.6 9.6   Liver Function Tests No results for input(s): AST, ALT, ALKPHOS, BILITOT, PROT, ALBUMIN in the last 72 hours.  No results for input(s): LIPASE, AMYLASE in the last 72 hours. Cardiac Enzymes No results for input(s): CKTOTAL, CKMB, CKMBINDEX, TROPONINI in the last 72 hours.  BNP: BNP (last 3 results) Recent Labs    03/14/21 0500  BNP 748.0*    ProBNP (last 3 results) No results for input(s): PROBNP in the last 8760 hours.   D-Dimer No results for input(s): DDIMER in the last 72 hours. Hemoglobin A1C No results for input(s): HGBA1C in the last 72 hours.  Fasting Lipid Panel No results for input(s): CHOL, HDL, LDLCALC, TRIG, CHOLHDL, LDLDIRECT in the last 72 hours.  Thyroid Function Tests No results for input(s): TSH, T4TOTAL, T3FREE, THYROIDAB in the last 72 hours.  Invalid input(s): FREET3   Other results:  Imaging    CT CHEST ABDOMEN PELVIS WO CONTRAST  Result Date: 03/17/2021 CLINICAL DATA:  Evaluation for left ventricular assist device. EXAM: CT CHEST, ABDOMEN AND PELVIS WITHOUT CONTRAST TECHNIQUE: Multidetector CT imaging of the chest, abdomen and pelvis was performed following the standard protocol without IV contrast. COMPARISON:  03/14/2021 chest radiograph. FINDINGS: CT CHEST FINDINGS Cardiovascular: A right-sided PICC line terminates in the high right atrium. Aortic atherosclerosis. Mild cardiomegaly with prior median sternotomy for CABG. Native coronary artery calcifications. Mediastinum/Nodes: Small middle and  anterior mediastinal nodes are likely reactive. Hilar regions poorly evaluated without intravenous contrast. Lungs/Pleura: Trace right pleural fluid. Right base subsegmental atelectasis. Left base scarring. Right upper lobe 2 mm pulmonary nodule on 46/4. Musculoskeletal: Median sternotomy with mild presternal and retrosternal interstitial thickening. No fluid collection. The manubrial portion of the median sternotomy demonstrates a fixation device in its superficial portion, without good bony fusion. Suspect fourth lateral left rib remote fracture. CT ABDOMEN PELVIS FINDINGS Hepatobiliary: Advanced cirrhosis, with caudate lobe enlargement and irregular capsule. Limited evaluation for focal lesion due to noncontrast technique. Cholecystectomy, without biliary ductal dilatation. Pancreas: Pancreatic atrophy, without duct dilatation or acute inflammation. Spleen: Spleen measures 11.9 x 10.5 x 7.1 cm (volume = 460 cm^3), borderline splenomegaly. Adrenals/Urinary Tract: Normal adrenal glands. No renal calculi or hydronephrosis. No hydroureter or ureteric calculi. No bladder calculi. Stomach/Bowel: Normal stomach, without wall thickening. Normal colon, appendix, and terminal ileum. Normal small bowel. Vascular/Lymphatic: Aortic atherosclerosis. Portal venous hypertension, with portosystemic collaterals in the splenic hilum on 56/3. No abdominopelvic adenopathy. Reproductive: Normal uterus and adnexa. Other: No significant free fluid. Musculoskeletal: No acute osseous abnormality. IMPRESSION: 1. No acute findings in the chest, abdomen, or pelvis. 2. Trace right pleural fluid. 3. Advanced cirrhosis and portal venous hypertension. 4. Aortic Atherosclerosis (ICD10-I70.0). 5. 2 mm right upper lobe pulmonary nodule. No follow-up needed if patient is low-risk. Non-contrast chest CT can be considered in 12 months if patient is high-risk. This recommendation follows the consensus statement: Guidelines for Management of Incidental  Pulmonary Nodules Detected on CT Images: From the Fleischner Society 2017; Radiology 2017; 284:228-243. Electronically Signed   By: Jeronimo Greaves M.D.   On: 03/17/2021 15:22     Medications:     Scheduled Medications:  aspirin EC  81 mg Oral Daily   atorvastatin  80 mg Oral Daily   busPIRone  5 mg Oral TID   Chlorhexidine Gluconate Cloth  6 each Topical Daily   clopidogrel  75 mg Oral Daily   dapagliflozin propanediol  10 mg Oral Daily   digoxin  0.125 mg Oral Daily   enoxaparin (LOVENOX) injection  40 mg Subcutaneous Q24H   feeding supplement  237 mL Oral BID BM   insulin aspart  0-15 Units Subcutaneous TID WC   insulin aspart  0-5 Units Subcutaneous QHS   losartan  12.5 mg Oral BID   multivitamin with minerals  1 tablet Oral Daily   sertraline  50 mg Oral Daily   sodium chloride flush  10-40 mL Intracatheter Q12H   sodium chloride flush  3 mL Intravenous Q12H   spironolactone  25 mg Oral Daily   torsemide  40 mg Oral Daily    Infusions:  sodium chloride      PRN Medications: sodium chloride, acetaminophen, ondansetron (ZOFRAN) IV, sodium chloride flush, sodium chloride flush   Assessment/Plan   1. CAD: s/p CABG x 4 4/22.  Cath in 7/22 with 3/4 grafts patent, occluded SVG-small D.  Native vessels, however, were  small and diseased (diabetic coronaries). No chest pain.    - Continue ASA 81 - Continue Plavix for now.  - Continue atorvastatin, good LDL.  2. Acute systolic CHF: Ischemic cardiomyopathy.  Echo at [facility] with EF 25-30%, moderate MR.  She was admitted to [facility] with CHF, started on inotropes and diuresed.  RHC showed elevated filling pressures and preserved CO (but was on inotropes at the time).  Unable to wean off inotropes successfully, sent here for consideration of LVAD.  She was on both dobutamine and milrinone at arrival, now off dobutamine and milrinone 0.125 CO-OX stable off milrinone at 61%  - Continue torsemide. Check CVP.  - Farxiga 10 mg daily and  spironolactone 25 daily.   - Continue digoxin 0.125 daily.  - Continue torsemide 40 mg daily.  - Cut back losartan to 12.5 mg at bed time. No room for Entresto.  -  Hopefuly can control with meds, but she appears to be a candidate for LVAD or OHT if needed. Blood Type O+  -Check BMET now. 3. Type 2 DM: HgbA1c 7.6.   - SSI, Farxiga for now.  4. Fe deficiency anemia: She had Feraheme and will get 2nd dose today.  - FOBT.   Home later  today. Will get follow up in HF clinic.   Amy Clegg NP-C  03/18/2021 8:09 AM   Length of Stay: 4  Amy Clegg, NP  03/18/2021, 8:09 AM  Advanced Heart Failure Team Pager [phone] (M-F; 7a - 5p)  Please contact [facility] for night-coverage after hours (5p -7a ) and weekends on [email or web address]   Patient seen with NP, agree with the above note.   Diuresed well yesterday, CVP down to 3.  Co-ox 61% off milrinone.  Digoxin level 0.7, creatinine mildly higher at 1.27.   General: NAD Neck: No JVD, no thyromegaly or thyroid nodule.  Lungs: Clear to auscultation bilaterally with normal respiratory effort. CV: Nondisplaced PMI.  Heart regular S1/S2, no S3/S4, no murmur.  No peripheral edema.   Abdomen: Soft, nontender, no hepatosplenomegaly, no distention.  Skin: Intact without lesions or rashes.  Neurologic: Alert and oriented x 3.  Psych: Normal affect. Extremities: No clubbing or cyanosis.  HEENT: Normal.   OK for home today.  Will need close followup in CHF clinic, transplant will likely be an option for her if she fails medical therapy.  I recommended that she go to cardiac rehab at [facility] (they have contacted her).   Meds for home: digoxin 0.0625 (lower dose), losartan 25 mg qhs, spironolactone 25 daily, torsemide 20 mg daily (lower dose), Farxiga 10 daily, ASA 81, Plavix, atorvastatin 80.   Marca Ancona 03/18/2021 8:30 AM

## 2021-03-18 NOTE — TOC Transition Note (Addendum)
Transition of Care [facility]) - CM/SW Discharge Note Heart Failure   Patient Details  Name: Andrea Mueller MRN: [id number] Date of Birth: Apr 18, 1956  Transition of [facility]) CM/SW Contact:  Vetta Couzens, LCSWA Phone Number: 03/18/2021, 12:08 PM   Clinical Narrative:    CSW spoke with the patient at bedside and completed a very brief SDOH with the patient who denied having any needs at this time. Ms. Lemus reported she does have a PCP and she can get to the pharmacy to pick up her medications. CSW provided the patient with social workers name and position and an appointment card for the Advanced [facility] outpatient clinic and encouraged her to follow up and to attend the appointment and bring her medications and if anything changes to please reach out so that CSW/HV clinic team can provide support. Ms. Hlavaty reported that her son is on his way to pick her up and take her home today and she will be able to come to the follow up HF appointment.  CSW will sign off for now as social work intervention is no longer needed. Please consult [place] again if new needs arise.   Final next level of care: Home/Self Care Barriers to Discharge: No Barriers Identified   Patient Goals and CMS Choice Patient states their goals for this hospitalization and ongoing recovery are:: just completed [facility] PT CMS Medicare.gov Compare Post Acute Care list provided to:: Patient    Discharge Placement                       Discharge Plan and Services In-house Referral: Clinical Social Work Discharge Planning Services: CM Consult Post Acute Care Choice: Home Health                               Social Determinants of Health (SDOH) Interventions Food Insecurity Interventions: Intervention Not Indicated Financial Strain Interventions: Intervention Not Indicated Housing Interventions: Intervention Not Indicated Transportation Interventions: Intervention Not Indicated   Readmission Risk Interventions No  flowsheet data found.   Berdine Rasmusson, MSW, LCSWA [phone] Heart Failure Social Worker

## 2021-03-18 NOTE — Progress Notes (Signed)
CARDIAC REHAB PHASE I   PRE:  Rate/Rhythm: 66 SR    BP: sitting 97/57    SaO2: 95 RA  MODE:  Ambulation: 350 ft   POST:  Rate/Rhythm: 80 SR    BP: sitting 100/51     SaO2: 98 RA  Ambulated independently at slow pace, x2 rest stops for SOB/fatigue. Pt declined walking farther. Encouraged her to slowly increase walking distance/time at home. All education done and reviewed. Referred to [facility]. [id number]  Harriet Masson CES, ACSM 03/18/2021 8:45 AM

## 2021-03-18 NOTE — Discharge Summary (Addendum)
Advanced Heart Failure Team  Discharge Summary   Patient ID: Andrea Mueller MRN: [id number], DOB/AGE: 23-Apr-1956 65 y.o. Admit date: 03/14/2021 D/C date:     03/18/2021   Primary Discharge Diagnoses:  Acute Systolic Heart Failure CAD DMII Fe Deficiency Anemia   Hospital Course:  Andrea Mueller is a 65 year old with h/o CAD, S/P CABG x64 11/2020, DMII, iron deficient anemia, anxiety/depression, CKD Stage III, chronic systolic heart failure, and ICM.   She had CABG x4  in April 11914 while living in [place]. She then relocated [facility] in May. She has had 2 hospital admits for A/C systolic heart failure.   Admitted to [facility] and was found to be in in acute heart failure. Her echocardiogram then revealed new, significant LV systolic dysfunction with moderate mitral regurgitation.  She underwent a left heart catheterization which revealed severe native disease, and occluded vein graft to a diagonal, and otherwise patent bypass grafts.  She had elevated biventricular filling pressures with a normal cardiac index, although this was on a small amount of dobutamine.  She was diuresed, and the team there attempted to wean her off of inotropes.  She reports that during this process she felt as though fluid quickly came back on.  Developed hypotensive, and inotropes were restarted.  She was transferred to [facility] for further management of her cardiomyopathy and consideration of advanced therapies.  Transferred to [facility] on dobutamine and milrinone. Both dobutamine and milrinone were gradually weaned off with stable mixed venous saturations. CVP was followed closely and stable on po torsemide. Started on farxiga, spironolactone, and losartan with stability. No bb with cardiogenic shock but may consider with as an outpatient. PICC line remove prior to discharge. Renal function remained stable.   VAD work up initiated. She will be followed closely in the HF clinic and has follow up next week. See below  for detailed problem list.   Outside facilities CV Testing: - 4/22 CABG: SVG-D, SVG-OM, LIMA-LAD, SVG RCA - 7/22 Coronary angiography: Totally occluded mid LAD, LCx, and RCA; 80-90% stenosis small D1; patent LIMA-small, diseased LAD, patent SVG-RCA with small PDA and PLV, patent SVG-OM but 90% stenosis in small native vessel at anastomosis, occluded SVG-D. - 7/22 RHC: mean RA 12, PA 55/19 mean 33, mean PCWP 25, CI 3.12 (on inotrope) - 7/22 echo: EF 25-30%, moderate MR  1. CAD: s/p CABG x 4 4/22.  Cath in 7/22 with 3/4 grafts patent, occluded SVG-small D.  Native vessels, however, were small and diseased (diabetic coronaries).  No chest pain.  - Continue ASA 81 - Continue Plavix for now. - Continue atorvastatin, good LDL. 2. Acute systolic CHF: Ischemic cardiomyopathy.  Echo at [facility] with EF 25-30%, moderate MR.  She was admitted to [facility] with CHF, started on inotropes and diuresed.  RHC showed elevated filling pressures and preserved CO (but was on inotropes at the time).  Unable to wean off inotropes successfully, sent here for consideration of LVAD.  She was on both dobutamine and milrinone at arrival, now off dobutamine and milrinone.  - CO-OX remained stable.  -CVP stable on torsemide.   - Farxiga 10 mg daily and spironolactone 25 daily.   - Continue digoxin 0.0625 daily. Check dig level at follow up.  - Continue torsemide 20 mg daily. - Continue losartan 25 mg daily, no BP room for Entresto.    -  Hopefuly can control with meds, but she appears to be a candidate for LVAD or OHT if needed. Blood  Type O+ -Check BMET  at his follow up.  3. Type 2 DM: HgbA1c 7.6.   - SSI, Farxiga for now. 4. Fe deficiency anemia: She had Feraheme and will get 2nd dose today.  - FOBT.   Discharge Weight: 172 pounds  Discharge Vitals: Blood pressure (!) 98/52, pulse 66, temperature 98.3 F (36.8 C), temperature source Oral, resp. rate 14, height 5\' 3"  (1.6 m), weight 78.3 kg, SpO2 96 %.  Labs: Lab  Results  Component Value Date   WBC 7.6 03/18/2021   HGB 10.3 (L) 03/18/2021   HCT 32.8 (L) 03/18/2021   MCV 83.5 03/18/2021   PLT 187 03/18/2021    Recent Labs  Lab 03/14/21 0500 03/15/21 0638 03/18/21 0424  NA 134*   < > 135  K 3.7   < > 4.2  CL 97*   < > 94*  CO2 25   < > 31  BUN 21   < > 24*  CREATININE 1.21*   < > 1.27*  CALCIUM 9.1   < > 9.6  PROT 6.5  --   --   BILITOT 0.6  --   --   ALKPHOS 84  --   --   ALT 20  --   --   AST 24  --   --   GLUCOSE 195*   < > 184*   < > = values in this interval not displayed.   Lab Results  Component Value Date   CHOL 64 03/14/2021   HDL 36 (L) 03/14/2021   LDLCALC 10 03/14/2021   TRIG 92 03/14/2021   BNP (last 3 results) Recent Labs    03/14/21 0500  BNP 748.0*    ProBNP (last 3 results) No results for input(s): PROBNP in the last 8760 hours.   Diagnostic Studies/Procedures   CT CHEST ABDOMEN PELVIS WO CONTRAST  Result Date: 03/17/2021 CLINICAL DATA:  Evaluation for left ventricular assist device. EXAM: CT CHEST, ABDOMEN AND PELVIS WITHOUT CONTRAST TECHNIQUE: Multidetector CT imaging of the chest, abdomen and pelvis was performed following the standard protocol without IV contrast. COMPARISON:  03/14/2021 chest radiograph. FINDINGS: CT CHEST FINDINGS Cardiovascular: A right-sided PICC line terminates in the high right atrium. Aortic atherosclerosis. Mild cardiomegaly with prior median sternotomy for CABG. Native coronary artery calcifications. Mediastinum/Nodes: Small middle and anterior mediastinal nodes are likely reactive. Hilar regions poorly evaluated without intravenous contrast. Lungs/Pleura: Trace right pleural fluid. Right base subsegmental atelectasis. Left base scarring. Right upper lobe 2 mm pulmonary nodule on 46/4. Musculoskeletal: Median sternotomy with mild presternal and retrosternal interstitial thickening. No fluid collection. The manubrial portion of the median sternotomy demonstrates a fixation device in  its superficial portion, without good bony fusion. Suspect fourth lateral left rib remote fracture. CT ABDOMEN PELVIS FINDINGS Hepatobiliary: Advanced cirrhosis, with caudate lobe enlargement and irregular capsule. Limited evaluation for focal lesion due to noncontrast technique. Cholecystectomy, without biliary ductal dilatation. Pancreas: Pancreatic atrophy, without duct dilatation or acute inflammation. Spleen: Spleen measures 11.9 x 10.5 x 7.1 cm (volume = 460 cm^3), borderline splenomegaly. Adrenals/Urinary Tract: Normal adrenal glands. No renal calculi or hydronephrosis. No hydroureter or ureteric calculi. No bladder calculi. Stomach/Bowel: Normal stomach, without wall thickening. Normal colon, appendix, and terminal ileum. Normal small bowel. Vascular/Lymphatic: Aortic atherosclerosis. Portal venous hypertension, with portosystemic collaterals in the splenic hilum on 56/3. No abdominopelvic adenopathy. Reproductive: Normal uterus and adnexa. Other: No significant free fluid. Musculoskeletal: No acute osseous abnormality. IMPRESSION: 1. No acute findings in the chest, abdomen, or pelvis.  2. Trace right pleural fluid. 3. Advanced cirrhosis and portal venous hypertension. 4. Aortic Atherosclerosis (ICD10-I70.0). 5. 2 mm right upper lobe pulmonary nodule. No follow-up needed if patient is low-risk. Non-contrast chest CT can be considered in 12 months if patient is high-risk. This recommendation follows the consensus statement: Guidelines for Management of Incidental Pulmonary Nodules Detected on CT Images: From the Fleischner Society 2017; Radiology 2017; 284:228-243. Electronically Signed   By: Jeronimo Greaves M.D.   On: 03/17/2021 15:22    Discharge Medications   Allergies as of 03/18/2021       Reactions   Codeine Nausea Only        Medication List     STOP taking these medications    furosemide 40 MG tablet Commonly known as: LASIX   metoprolol tartrate 25 MG tablet Commonly known as:  LOPRESSOR       TAKE these medications    Aspirin Low Dose 81 MG EC tablet Generic drug: aspirin Take 1 tablet (81 mg total) by mouth daily. Swallow whole.   atorvastatin 80 MG tablet Commonly known as: LIPITOR Take 1 tablet (80 mg total) by mouth daily. What changed:  medication strength how much to take when to take this   busPIRone 5 MG tablet Commonly known as: BUSPAR Take 1 tablet (5 mg total) by mouth 3 (three) times daily.   clopidogrel 75 MG tablet Commonly known as: PLAVIX Take 75 mg by mouth daily.   digoxin 0.125 MG tablet Commonly known as: LANOXIN Take 0.5 tablets (0.0625 mg total) by mouth daily.   Farxiga 10 MG Tabs tablet Generic drug: dapagliflozin propanediol Take 1 tablet (10 mg total) by mouth daily.   Fish Oil 1000 MG Caps Take 1 capsule by mouth daily.   insulin NPH-regular Human (70-30) 100 UNIT/ML injection Inject 20 Units into the skin 2 (two) times daily with a meal.   LORazepam 0.5 MG tablet Commonly known as: ATIVAN Take 0.25-0.5 mg by mouth daily as needed for anxiety.   losartan 25 MG tablet Commonly known as: COZAAR Take 1 tablet (25 mg total) by mouth at bedtime.   sertraline 50 MG tablet Commonly known as: ZOLOFT Take 1 tablet (50 mg total) by mouth daily.   spironolactone 25 MG tablet Commonly known as: ALDACTONE Take 1 tablet (25 mg total) by mouth daily.   torsemide 20 MG tablet Commonly known as: DEMADEX Take 1 tablet (20 mg total) by mouth daily.        Disposition   The patient will be discharged in stable condition to home. Discharge Instructions     (HEART FAILURE PATIENTS) Call MD:  Anytime you have any of the following symptoms: 1) 3 pound weight gain in 24 hours or 5 pounds in 1 week 2) shortness of breath, with or without a dry hacking cough 3) swelling in the hands, feet or stomach 4) if you have to sleep on extra pillows at night in order to breathe.   Complete by: As directed    Amb Referral to  Cardiac Rehabilitation   Complete by: As directed    To [facility]   Diagnosis: Heart Failure (see criteria below if ordering Phase II)   Heart Failure Type: Chronic Systolic & Diastolic   After initial evaluation and assessments completed: Virtual Based Care may be provided alone or in conjunction with Phase 2 Cardiac Rehab based on patient barriers.: Yes   Diet - low sodium heart healthy   Complete by: As directed  Heart Failure patients record your daily weight using the same scale at the same time of day   Complete by: As directed    Increase activity slowly   Complete by: As directed        Follow-up Information     [facility] Follow up on 03/25/2021.   Specialty: Cardiology Why: at  330. Located in the [facility]. Free Film/video editor information: [address] [id number] Wilhemina Bonito [place] [address] [phone]                  Duration of Discharge Encounter: Greater than 35 minutes   Signed, Tonye Becket  NP-C  03/18/2021, 10:42 AM  Patient seen with NP, CVP and co-ox stable, not volume overloaded on exam.  Ok for home on the above medications with close followup in CHF clinic.   Marca Ancona 03/18/2021 10:46 AM

## 2021-03-20 LAB — LUPUS ANTICOAGULANT PANEL
DRVVT: 37.5 s (ref 0.0–47.0)
PTT Lupus Anticoagulant: 36 s (ref 0.0–51.9)

## 2021-03-24 NOTE — Progress Notes (Signed)
Advanced Heart Failure Clinic Note    PCP: Jeanice Lim, PA-C PCP-Cardiologist: None  HF Cardiologist: Dr. Shirlee Latch  HPI: Andrea Mueller is a 65 y.o. female with hx of CAD s/p 4v CABG (11/2020), type 2 diabetes mellitus, anxiety/depression, CKD III, and recently diagnosed ICM.  She was diagnosed with coronary artery disease back in April 2022 after presenting to a hospital in [place] with fatigue and diaphoresis.  She underwent four-vessel CABG in [place] and appeared to initially recover quite well.  She returned to her home in [place] in May 2022, and was hospitalized 5/22 for decompensated heart failure and right lower leg cellulitis.  Her echo revealed normal LV systolic function.    In July 2022, she started to become more short of breath and felt pressure in her chest.  She presented to [facility] in [place] and was found to be in acute heart failure.  Her echo then (7/22) revealed new, significant LV systolic dysfunction with moderate mitral regurgitation.  She underwent a LHC which revealed severe native disease, and occluded vein graft to a diagonal, and otherwise patent bypass grafts.  She had elevated biventricular filling pressures with a normal cardiac index, although this was on a small amount of dobutamine.  She was diuresed and inotropes wean was attempted. She was reportedly hypotensive, and inotropes were restarted.  She was transferred to [facility], on dobutamine and milrinone,  for further management of her cardiomyopathy and consideration of advanced therapies. Inotropes were gradually weaned off with stable mixed venous saturations. CVP was followed closely and stable on po torsemide. Started on Farxiga, spironolactone, and losartan with stability. No bb with cardiogenic shock, but may consider with as an outpatient. Renal function remained stable. VAD workup initiated. D/c weight 172 lbs.  Today she returns for post hospitalization HF follow up with her son. Overall  feeling fine, some SOB walking around the house. Denies CP, dizziness, edema. + orthopnea though. Appetite ok. No fever or chills. Weight at home 173-176 pounds. Taking all medications. She has not started CR yet at [facility],  Review of Systems: [y] = yes, [ ]  = no   General: Weight gain [ ] ; Weight loss [ ] ; Anorexia [ ] ; Fatigue ]; Fever [ ] ; Chills [ ] ; Weakness [ ]   Cardiac: Chest pain/pressure [ ] ; Resting SOB [ ] ; Exertional SOB ]; Orthopnea [ y]; Pedal Edema [ ] ; Palpitations [ ] ; Syncope [ ] ; Presyncope [ ] ; Paroxysmal nocturnal dyspnea[ ]   Pulmonary: Cough [ ] ; Wheezing[ ] ; Hemoptysis[ ] ; Sputum [ ] ; Snoring [ ]   GI: Vomiting[ ] ; Dysphagia[ ] ; Melena[ ] ; Hematochezia [ ] ; Heartburn[ ] ; Abdominal pain [ ] ; Constipation [ ] ; Diarrhea [ ] ; BRBPR [ ]   GU: Hematuria[ ] ; Dysuria [ ] ; Nocturia[ ]   Vascular: Pain in legs with walking [ ] ; Pain in feet with lying flat [ ] ; Non-healing sores [ ] ; Stroke [ ] ; TIA [ ] ; Slurred speech [ ] ;  Neuro: Headaches[ ] ; Vertigo[ ] ; Seizures[ ] ; Paresthesias[ ] ;Blurred vision [ ] ; Diplopia [ ] ; Vision changes [ ]   Ortho/Skin: Arthritis [ ] ; Joint pain [ ] ; Muscle pain [ ] ; Joint swelling [ ] ; Back Pain [ ] ; Rash [ ]   Psych: Depression[ ] ; Anxiety[ ]   Heme: Bleeding problems [ ] ; Clotting disorders [ ] ; Anemia [ ]   Endocrine: Diabetes ]; Thyroid dysfunction[ ]   Cardiac Studies: - CABG (4/22): SVG-D, SVG-OM, LIMA-LAD, SVG RCA - Coronary angiography (7/22): Totally occluded mid LAD, LCx, and RCA; 80-90% stenosis small  D1; patent LIMA-small, diseased LAD, patent SVG-RCA with small PDA and PLV, patent SVG-OM but 90% stenosis in small native vessel at anastomosis, occluded SVG-D. - RHC (7/22): mean RA 12, PA 55/19 mean 33, mean PCWP 25, CI 3.12 (on inotrope) - Echo (7/22): EF 25-30%, moderate MR  Past Medical History:  Diagnosis Date   CAD (coronary artery disease), native coronary artery    CKD (chronic kidney disease) stage 3, GFR 30-59 ml/min (HCC)     Type 2 diabetes mellitus treated without insulin (HCC)    Current Outpatient Medications  Medication Sig Dispense Refill   aspirin 81 MG EC tablet Take 1 tablet (81 mg total) by mouth daily. Swallow whole. 30 tablet 11   atorvastatin (LIPITOR) 80 MG tablet Take 1 tablet (80 mg total) by mouth daily. 30 tablet 6   busPIRone (BUSPAR) 5 MG tablet Take 1 tablet (5 mg total) by mouth 3 (three) times daily. 90 tablet 6   clopidogrel (PLAVIX) 75 MG tablet Take 75 mg by mouth daily.     dapagliflozin propanediol (FARXIGA) 10 MG TABS tablet Take 1 tablet (10 mg total) by mouth daily. 30 tablet 6   digoxin (LANOXIN) 0.125 MG tablet Take 0.5 tablets (0.0625 mg total) by mouth daily. 15 tablet 6   insulin NPH-regular Human (70-30) 100 UNIT/ML injection Inject 20 Units into the skin 2 (two) times daily with a meal.     LORazepam (ATIVAN) 0.5 MG tablet Take 0.25-0.5 mg by mouth daily as needed for anxiety.     losartan (COZAAR) 25 MG tablet Take 1 tablet (25 mg total) by mouth at bedtime. 30 tablet 6   Omega-3 Fatty Acids (FISH OIL) 1000 MG CAPS Take 1 capsule by mouth daily.     sertraline (ZOLOFT) 50 MG tablet Take 1 tablet (50 mg total) by mouth daily. 30 tablet 6   spironolactone (ALDACTONE) 25 MG tablet Take 1 tablet (25 mg total) by mouth daily. 30 tablet 6   torsemide (DEMADEX) 20 MG tablet Take 1 tablet (20 mg total) by mouth daily. 30 tablet 6   No current facility-administered medications for this encounter.   Allergies  Allergen Reactions   Codeine Nausea Only   Social History   Socioeconomic History   Marital status: Widowed    Spouse name: Not on file   Number of children: Not on file   Years of education: Not on file   Highest education level: Not on file  Occupational History   Not on file  Tobacco Use   Smoking status: Former    Packs/day: 1.00    Years: 15.00    Pack years: 15.00    Types: Cigarettes    Start date: 08/16/1993    Quit date: 08/16/2000    Years since  quitting: 20.6   Smokeless tobacco: Never  Substance and Sexual Activity   Alcohol use: Not Currently   Drug use: Not Currently   Sexual activity: Not Currently  Other Topics Concern   Not on file  Social History Narrative   Not on file   Social Determinants of Health   Financial Resource Strain: Low Risk    Difficulty of Paying Living Expenses: Not very hard  Food Insecurity: No Food Insecurity   Worried About Running Out of Food in the Last Year: Never true   Ran Out of Food in the Last Year: Never true  Transportation Needs: No Transportation Needs   Lack of Transportation (Medical): No   Lack of Transportation (Non-Medical): No  Physical Activity: Not on file  Stress: Not on file  Social Connections: Not on file  Intimate Partner Violence: Not on file    No family history on file.  BP (!) 100/57   Pulse (!) 56   Wt 80.6 kg (177 lb 12.8 oz)   SpO2 97%   BMI 31.50 kg/m   Wt Readings from Last 3 Encounters:  03/25/21 80.6 kg (177 lb 12.8 oz)  03/18/21 78.3 kg (172 lb 9.9 oz)   PHYSICAL EXAM: General:  NAD. No resp difficulty HEENT: Normal Neck: Supple. No JVD. Carotids 2+ bilat; no bruits. No lymphadenopathy or thryomegaly appreciated. Cor: PMI nondisplaced. Regular rate & rhythm. No rubs, gallops or murmurs. Lungs: Clear Abdomen: Soft, nontender, nondistended. No hepatosplenomegaly. No bruits or masses. Good bowel sounds. Extremities: No cyanosis, clubbing, rash, edema Neuro: Alert & oriented x 3, cranial nerves grossly intact. Moves all 4 extremities w/o difficulty. Affect pleasant.  ECG: SB rBBB 56 bpm (personally reviewed)  ASSESSMENT & PLAN: 1. CAD: s/p CABG x 4 (4/22).  Cath in 7/22 with 3/4 grafts patent, occluded SVG-small D.  Native vessels, however, were small and diseased (diabetic coronaries). No chest pain.    - Continue ASA 81 + Plavix. - Continue atorvastatin, good LDL. 2. Systolic CHF: Ischemic cardiomyopathy.  Echo at [facility] with EF 25-30%,  moderate MR.  She was admitted to [facility] with CHF, started on inotropes and diuresed.  RHC showed elevated filling pressures and preserved CO (but was on inotropes at the time).  Unable to wean off inotropes successfully, sent to [facility] for consideration of LVAD. Able to wean off dobutamine and milrinone successfully.  NYHA II-early III although not very active currently, she is not volume overloaded today. - No beta blocker yet with bradycardia and recent decompensation. - Continue Farxiga 10 mg daily - Continue spironolactone 25 daily.   - Continue digoxin 0.125 daily. Check dig level today. - Continue torsemide 20 mg daily. - Continue losartan 25 mg at bed time. No room for Entresto. - Hopefuly can control with meds, but she appears to be a candidate for LVAD or OHT if needed. Blood Type O+ - Will send note to CR at [facility] for her to be able to start their program. 3. Type 2 DM: HgbA1c 7.6.   - Continue Farxiga. 4. Fe deficiency anemia: She had Feraheme inpatient. - CBC next visit.  Follow up with PharmD in 3 weeks and with APP in 6 weeks.  Anderson Malta Garland, FNP 03/25/21

## 2021-03-25 ENCOUNTER — Encounter (HOSPITAL_COMMUNITY): Payer: Self-pay

## 2021-03-25 ENCOUNTER — Ambulatory Visit (HOSPITAL_COMMUNITY)
Admit: 2021-03-25 | Discharge: 2021-03-25 | Disposition: A | Discharge status: Home / Self Care | Payer: 59 | Source: Ambulatory Visit | Attending: Family Medicine | Admitting: Family Medicine

## 2021-03-25 ENCOUNTER — Other Ambulatory Visit: Payer: Self-pay

## 2021-03-25 VITALS — BP 100/57 | HR 56 | Wt 177.8 lb

## 2021-03-25 DIAGNOSIS — Z87891 Personal history of nicotine dependence: Secondary | ICD-10-CM | POA: Diagnosis not present

## 2021-03-25 DIAGNOSIS — Z79899 Other long term (current) drug therapy: Secondary | ICD-10-CM | POA: Diagnosis not present

## 2021-03-25 DIAGNOSIS — R001 Bradycardia, unspecified: Secondary | ICD-10-CM | POA: Insufficient documentation

## 2021-03-25 DIAGNOSIS — Z7902 Long term (current) use of antithrombotics/antiplatelets: Secondary | ICD-10-CM | POA: Insufficient documentation

## 2021-03-25 DIAGNOSIS — Z794 Long term (current) use of insulin: Secondary | ICD-10-CM | POA: Insufficient documentation

## 2021-03-25 DIAGNOSIS — I5042 Chronic combined systolic (congestive) and diastolic (congestive) heart failure: Secondary | ICD-10-CM

## 2021-03-25 DIAGNOSIS — Z885 Allergy status to narcotic agent status: Secondary | ICD-10-CM | POA: Diagnosis not present

## 2021-03-25 DIAGNOSIS — D509 Iron deficiency anemia, unspecified: Secondary | ICD-10-CM | POA: Diagnosis not present

## 2021-03-25 DIAGNOSIS — Z7982 Long term (current) use of aspirin: Secondary | ICD-10-CM | POA: Diagnosis not present

## 2021-03-25 DIAGNOSIS — E119 Type 2 diabetes mellitus without complications: Secondary | ICD-10-CM | POA: Diagnosis not present

## 2021-03-25 DIAGNOSIS — I2510 Atherosclerotic heart disease of native coronary artery without angina pectoris: Secondary | ICD-10-CM

## 2021-03-25 DIAGNOSIS — E1122 Type 2 diabetes mellitus with diabetic chronic kidney disease: Secondary | ICD-10-CM | POA: Diagnosis not present

## 2021-03-25 DIAGNOSIS — I255 Ischemic cardiomyopathy: Secondary | ICD-10-CM | POA: Insufficient documentation

## 2021-03-25 LAB — BASIC METABOLIC PANEL
Anion gap: 9 (ref 5–15)
BUN: 23 mg/dL (ref 8–23)
CO2: 30 mmol/L (ref 22–32)
Calcium: 9.3 mg/dL (ref 8.9–10.3)
Chloride: 96 mmol/L — ABNORMAL LOW (ref 98–111)
Creatinine, Ser: 1.24 mg/dL — ABNORMAL HIGH (ref 0.44–1.00)
GFR, Estimated: 49 mL/min — ABNORMAL LOW (ref >60)
Glucose, Bld: 156 mg/dL — ABNORMAL HIGH (ref 70–99)
Potassium: 4 mmol/L (ref 3.5–5.1)
Sodium: 135 mmol/L (ref 135–145)

## 2021-03-25 LAB — DIGOXIN LEVEL: Digoxin Level: 0.6 ng/mL — ABNORMAL LOW (ref 0.8–2.0)

## 2021-03-25 NOTE — Patient Instructions (Signed)
It was great to see you today! No medication changes are needed at this time.   Labs today We will only contact you if something comes back abnormal or we need to make some changes. Otherwise no news is good news!  Your physician recommends that you schedule a follow-up appointment in: 3-4 weeks with pharmacy team and in 6-8 weeks with  in the Advanced Practitioners (PA/NP) Clinic     Do the following things EVERYDAY: Weigh yourself in the morning before breakfast. Write it down and keep it in a log. Take your medicines as prescribed Eat low salt foods--Limit salt (sodium) to 2000 mg per day.  Stay as active as you can everyday Limit all fluids for the day to less than 2 liters  milAt the Advanced Heart Failure Clinic, you and your health needs are our priority. As part of our continuing mission to provide you with exceptional heart care, we have created designated Provider Care Teams. These Care Teams include your primary Cardiologist (physician) and Advanced Practice Providers (APPs- Physician Assistants and Nurse Practitioners) who all work together to provide you with the care you need, when you need it.   You may see any of the following providers on your designated Care Team at your next follow up: Dr Arvilla Meres Dr Marca Ancona Dr Brandon Melnick, NP Robbie Lis, [place] Mikki Santee Karle Plumber, PharmD   Please be sure to bring in all your medications bottles to every appointment.

## 2021-03-30 LAB — FACTOR 5 LEIDEN

## 2021-04-10 ENCOUNTER — Other Ambulatory Visit (HOSPITAL_COMMUNITY): Payer: Self-pay

## 2021-04-13 NOTE — Progress Notes (Signed)
PCP: Jeanice Lim, PA-C PCP-Cardiologist: None  HF Cardiologist: Dr. Shirlee Latch  HPI:  Andrea Mueller is a 65 y.o. female with hx of CAD s/p 4v CABG (11/2020), type 2 diabetes mellitus, anxiety/depression, CKD III, and recently diagnosed ICM.   She was diagnosed with coronary artery disease back in April 2022 after presenting to a hospital in [place] with fatigue and diaphoresis.  She underwent four-vessel CABG in [place] and appeared to initially recover quite well.  She returned to her home in [place] in May 2022, and was hospitalized 01/04/21 for decompensated heart failure and right lower leg cellulitis.  Her echo revealed normal LV systolic function.     In July 2022, she started to become more short of breath and felt pressure in her chest.  She presented to [facility] in [place] and was found to be in acute heart failure.  Her echo then (02/2021) revealed new, significant LV systolic dysfunction with moderate mitral regurgitation.  She underwent a LHC which revealed severe native disease, and occluded vein graft to a diagonal, and otherwise patent bypass grafts.  She had elevated biventricular filling pressures with a normal cardiac index, although this was on a small amount of dobutamine.  She was diuresed and inotropes wean was attempted. She was reportedly hypotensive, and inotropes were restarted.  She was transferred to [facility], on dobutamine and milrinone,  for further management of her cardiomyopathy and consideration of advanced therapies. Inotropes were gradually weaned off with stable mixed venous saturations. CVP was followed closely and stable on oral torsemide. Started on Farxiga, spironolactone, and losartan with stability. No beta blocker with cardiogenic shock, but may consider as an outpatient. Renal function remained stable. VAD workup initiated. D/c weight 172 lbs.   Recently presented to HF Clinic for post hospitalization HF follow up with her son on 03/25/21. Overall was  feeling fine, some SOB walking around the house. Denied CP, dizziness, edema. Was positive for orthopnea though. Appetite was ok. No fever or chills. Weight at home was 173-176 pounds. Reported taking all medications. She had not started CR yet at [facility].  Today she returns to HF clinic for pharmacist medication titration. At last visit with APP,  no medication changes were made given low BP in clinic. Overall she is feeling well today. Has been doing cardiac rehab at [facility] 3 times per week. Has been walking on the treadmill for 25 minutes followed by using the exercise bike for 25 minutes. SOB improving. No dizziness or lightheadedness. Had some chest tightness, but has not recurred in approximately 2 weeks. Weight down 7 lbs from last clinic visit. Weight loss intentional as she has been exercising and dieting. Has been limiting sugar and sodium intake. Takes torsemide 20 mg daily and has not needed any extra. No LEE, PND or orthopnea. Taking all medications as prescribed and tolerating all medications.    HF Medications: Losartan 25 mg daily Spironolactone 25 mg daily Farxiga 10 mg daily Digoxin 0.625 mg daily Torsemide 20 mg daily  Has the patient been experiencing any side effects to the medications prescribed?  no  Does the patient have any problems obtaining medications due to transportation or finances?   No - has [employer]  Understanding of regimen: good Understanding of indications: good Potential of compliance: excellent Patient understands to avoid NSAIDs. Patient understands to avoid decongestants.    Pertinent Lab Values: 03/25/21: Serum creatinine 1.24, BUN 23, Potassium 4.0, Sodium 135, Digoxin 0.6 ng/mL   Vital Signs: Weight: 170.8 lbs (  last clinic weight: 177.8 lbs) Blood pressure: 104/62  Heart rate: 60   Assessment/Plan: 1. CAD: s/p CABG x 4 (11/2020).  Cath in 02/2021 with 3/4 grafts patent, occluded SVG-small D.  Native vessels, however,  were small and diseased (diabetic coronaries). No chest pain.    - Continue ASA 81 + Plavix. - Continue atorvastatin, good LDL. 2. Systolic CHF: Ischemic cardiomyopathy.  Echo at [facility] with EF 25-30%, moderate MR.  She was admitted to [facility] with CHF, started on inotropes and diuresed.  RHC showed elevated filling pressures and preserved CO (but was on inotropes at the time).  Unable to wean off inotropes successfully, sent to [facility] for consideration of LVAD. Able to wean off dobutamine and milrinone successfully.  NYHA II-early III, she is not volume overloaded today. - Continue torsemide 20 mg daily. - No beta blocker yet with bradycardia and recent decompensation. - Continue losartan 25 mg nightly. No room for Entresto. - Continue spironolactone 25 mg daily. - Continue Farxiga 10 mg daily   - Continue digoxin 0.125 mg daily. Digoxin level 0.6 ng/mL on 03/25/21. - No medication changes given low BP/HR in clinic - Continue cardiac rehab - Hopefuly can control with meds, but she appears to be a candidate for LVAD or OHT if needed. Blood Type O+ 3. Type 2 DM: HgbA1c 7.6%.   - Continue Farxiga. 4. Fe deficiency anemia: She had Feraheme inpatient.  Follow up 2 weeks with APP Clinic   Karle Plumber, PharmD, BCPS, BCCP, CPP Heart Failure Clinic Pharmacist [phone]

## 2021-04-15 ENCOUNTER — Telehealth (HOSPITAL_COMMUNITY): Payer: Self-pay | Admitting: *Deleted

## 2021-04-15 NOTE — Telephone Encounter (Signed)
Contacted patient per order for request from VAD Coordinators for Cardiopulmonary Exercise Test. Patient unavailable, left voicemail for patient to return call at [phone] to schedule CPX.  Will continue to try to schedule patient.   Reggy Eye, MS, ACSM-RCEP Clinical Exercise Physiologist

## 2021-04-22 ENCOUNTER — Other Ambulatory Visit: Payer: Self-pay

## 2021-04-22 ENCOUNTER — Ambulatory Visit (HOSPITAL_COMMUNITY)
Admission: RE | Admit: 2021-04-22 | Discharge: 2021-04-22 | Disposition: A | Discharge status: Home / Self Care | Payer: 59 | Source: Ambulatory Visit | Attending: Cardiology | Admitting: Cardiology

## 2021-04-22 VITALS — BP 104/62 | HR 60 | Wt 170.8 lb

## 2021-04-22 DIAGNOSIS — D539 Nutritional anemia, unspecified: Secondary | ICD-10-CM | POA: Diagnosis not present

## 2021-04-22 DIAGNOSIS — I255 Ischemic cardiomyopathy: Secondary | ICD-10-CM | POA: Insufficient documentation

## 2021-04-22 DIAGNOSIS — I5022 Chronic systolic (congestive) heart failure: Secondary | ICD-10-CM | POA: Insufficient documentation

## 2021-04-22 DIAGNOSIS — Z951 Presence of aortocoronary bypass graft: Secondary | ICD-10-CM | POA: Diagnosis not present

## 2021-04-22 DIAGNOSIS — E119 Type 2 diabetes mellitus without complications: Secondary | ICD-10-CM | POA: Insufficient documentation

## 2021-04-22 DIAGNOSIS — Z79899 Other long term (current) drug therapy: Secondary | ICD-10-CM | POA: Insufficient documentation

## 2021-04-22 DIAGNOSIS — I2510 Atherosclerotic heart disease of native coronary artery without angina pectoris: Secondary | ICD-10-CM | POA: Diagnosis not present

## 2021-04-22 NOTE — Patient Instructions (Addendum)
It was a pleasure seeing you today!  MEDICATIONS: -No medication changes today -Call if you have questions about your medications.  NEXT APPOINTMENT: Return to clinic in 2 weeks with Heart Failure Clinic.  In general, to take care of your heart failure: -Limit your fluid intake to 2 Liters (half-gallon) per day.   -Limit your salt intake to ideally 2-3 grams (2000-3000 mg) per day. -Weigh yourself daily and record, and bring that "weight diary" to your next appointment.  (Weight gain of 2-3 pounds in 1 day typically means fluid weight.) -The medications for your heart are to help your heart and help you live longer.   -Please contact us before stopping any of your heart medications.  Call the clinic at [phone] with questions or to reschedule future appointments.

## 2021-04-22 NOTE — Progress Notes (Signed)
Heart and Vascular Care Navigation  04/22/2021  Andrea Mueller Nov 18, 1955 [id number]  Reason for Referral:    Engaged with patient face to face for initial visit for Heart and Vascular Care Coordination.                                                                                                   Assessment:     CSW consulted to meet with pt regarding insurance concerns as pt has insurance that is out of network with the [facility] system and is not very affordable at this time.  States that it cost her $2,000 a month at this time but states there is some supplementation from the government through the [facility] but it has been changing with her income.    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  Recently had her mom move in after her mom had medical concerns- mom gets social security of around $900/month.  Adult dtr also lives in the home but doesn't work due to medical concerns and doesn't have a source of income.                                 HRT/VAS Care Coordination     Living arrangements for the past 2 months Single Family Home   Lives with: Parents; Adult Children   Patient Current Optometrist   Patient Has Concern With Paying Medical Bills Yes   Patient Concerns With Medical Bills future concerns with possible upcoming LVAD vs transplant   Medical Bill Referrals: medicaid/ ACA- informed would not qualify for Medicaid at this time due to income level but if the MDs think she needs to stop working due to medical condition could consider disability and Medicaid   Does Patient Have Prescription Coverage? Yes   Home Assistive Devices/Equipment Walker (specify type)       Social History:                                                                             SDOH Screenings   Alcohol Screen: Not on file  Depression (PHQ2-9): Not on file   Financial Resource Strain: Medium Risk   Difficulty of Paying Living Expenses: Somewhat hard  Food Insecurity: Food Insecurity Present   Worried About Programme researcher, broadcasting/film/video in the Last Year: Sometimes true   Ran Out of Food in the Last Year: Never true  Housing: Low Risk    Last Housing Risk Score: 0  Physical Activity: Not on file  Social Connections: Not on file  Stress: Not on file  Tobacco Use: Medium Risk   Smoking  Tobacco Use: Former   Smokeless Tobacco Use: Never  Transportation Needs: No Regulatory affairs officer (Medical): No   Lack of Transportation (Non-Medical): No    SDOH Interventions: Financial Resources:  Corporate treasurer Interventions: Intervention Not Indicated Social Security for Medical illustrator Insecurity:  Food Insecurity Interventions: Assist with [employer], Other (Comment) (food pantry list)  Housing Insecurity:   Non reported at this time  Transportation:    Receives assistance from her son    Follow-up plan:    Pt will have to look at [employer] options and see what is affordable for her at this time as she will not qualify for Medicaid with current income and is not eligible for Medicare until late 2023.  Pt reported some concerns with having enough food so CSW provided with food stamp application and instructions and informed her to follow up with DSS.  Also provided with food pantry list though pt reports being familiar with some local pantries if she needs to go that route.  Pt will reach out to CSW to discuss if further financial concerns arise.  Will continue to follow through clinic and assist as needed  Burna Sis, LCSW Clinical Social Worker Advanced Heart Failure Clinic Desk#: [phone] Cell#: [phone]

## 2021-04-23 ENCOUNTER — Other Ambulatory Visit (HOSPITAL_COMMUNITY): Payer: Self-pay

## 2021-04-24 ENCOUNTER — Other Ambulatory Visit (HOSPITAL_COMMUNITY): Payer: Self-pay

## 2021-04-28 ENCOUNTER — Other Ambulatory Visit (HOSPITAL_COMMUNITY): Payer: Self-pay

## 2021-05-04 ENCOUNTER — Ambulatory Visit (HOSPITAL_COMMUNITY): Payer: 59 | Attending: Internal Medicine

## 2021-05-04 ENCOUNTER — Other Ambulatory Visit (HOSPITAL_COMMUNITY): Payer: Self-pay | Admitting: *Deleted

## 2021-05-04 ENCOUNTER — Other Ambulatory Visit: Payer: Self-pay

## 2021-05-04 DIAGNOSIS — I5042 Chronic combined systolic (congestive) and diastolic (congestive) heart failure: Secondary | ICD-10-CM

## 2021-05-07 NOTE — Progress Notes (Signed)
Advanced Heart Failure Clinic Note    PCP: Jeanice Lim, PA-C PCP-Cardiologist: None  HF Cardiologist: Dr. Shirlee Latch  HPI: Andrea Mueller is a 65 y.o. female with hx of CAD s/p 4v CABG (11/2020), type 2 diabetes mellitus, anxiety/depression, CKD III, and recently diagnosed ICM.  She was diagnosed with coronary artery disease back in April 2022 after presenting to a hospital in [place] with fatigue and diaphoresis.  She underwent four-vessel CABG in [place] and appeared to initially recover quite well.  She returned to her home in [place] in May 2022, and was hospitalized 5/22 for decompensated heart failure and right lower leg cellulitis.  Her echo revealed normal LV systolic function.    In July 2022, she started to become more short of breath and felt pressure in her chest.  She presented to [facility] in [place] and was found to be in acute heart failure.  Her echo then (7/22) revealed new, significant LV systolic dysfunction with moderate mitral regurgitation.  She underwent a LHC which revealed severe native disease, and occluded vein graft to a diagonal, and otherwise patent bypass grafts.  She had elevated biventricular filling pressures with a normal cardiac index, although this was on a small amount of dobutamine.  She was diuresed and inotropes wean was attempted. She was reportedly hypotensive, and inotropes were restarted.  She was transferred to [facility], on dobutamine and milrinone,  for further management of her cardiomyopathy and consideration of advanced therapies. Inotropes were gradually weaned off with stable mixed venous saturations. CVP was followed closely and stable on po torsemide. Started on Farxiga, spironolactone, and losartan with stability. No bb with cardiogenic shock, but may consider with as an outpatient. Renal function remained stable. VAD workup initiated. D/c weight 172 lbs.  Today she returns for HF follow up with her son. She remains SOB walking on  flat ground at the grocery store but says she feels some better. Some chest pain/squeeze randomly, lasting 5-10 minutes, frequency has improved although this happens 2-3x/week. Denies dizziness, abnormal bleeding, edema, or PND/Orthopnea. Appetite ok. Weight at home 168 pounds. Taking all medications. Doing CR at [facility] 3x/week.  Cardiac Studies: - CABG (4/22): SVG-D, SVG-OM, LIMA-LAD, SVG RCA - Coronary angiography (7/22): Totally occluded mid LAD, LCx, and RCA; 80-90% stenosis small D1; patent LIMA-small, diseased LAD, patent SVG-RCA with small PDA and PLV, patent SVG-OM but 90% stenosis in small native vessel at anastomosis, occluded SVG-D. - RHC (7/22): mean RA 12, PA 55/19 mean 33, mean PCWP 25, CI 3.12 (on inotrope) - Echo (7/22): EF 25-30%, moderate MR  Past Medical History:  Diagnosis Date   CAD (coronary artery disease), native coronary artery    CKD (chronic kidney disease) stage 3, GFR 30-59 ml/min (HCC)    Type 2 diabetes mellitus treated without insulin (HCC)    Current Outpatient Medications  Medication Sig Dispense Refill   aspirin 81 MG EC tablet Take 1 tablet (81 mg total) by mouth daily. Swallow whole. 30 tablet 11   atorvastatin (LIPITOR) 80 MG tablet Take 1 tablet (80 mg total) by mouth daily. 30 tablet 6   busPIRone (BUSPAR) 5 MG tablet Take 1 tablet (5 mg total) by mouth 3 (three) times daily. 90 tablet 6   clopidogrel (PLAVIX) 75 MG tablet Take 75 mg by mouth daily.     dapagliflozin propanediol (FARXIGA) 10 MG TABS tablet Take 1 tablet (10 mg total) by mouth daily. 30 tablet 6   digoxin (LANOXIN) 0.125 MG tablet Take 0.5 tablets (0.0625 mg  total) by mouth daily. 15 tablet 6   insulin NPH-regular Human (70-30) 100 UNIT/ML injection Inject 20 Units into the skin 2 (two) times daily with a meal.     LORazepam (ATIVAN) 0.5 MG tablet Take 0.25-0.5 mg by mouth daily as needed for anxiety.     losartan (COZAAR) 25 MG tablet Take 1 tablet (25 mg total) by mouth at bedtime. 30  tablet 6   Omega-3 Fatty Acids (FISH OIL) 1000 MG CAPS Take 1 capsule by mouth daily.     sertraline (ZOLOFT) 50 MG tablet Take 1 tablet (50 mg total) by mouth daily. 30 tablet 6   spironolactone (ALDACTONE) 25 MG tablet Take 1 tablet (25 mg total) by mouth daily. 30 tablet 6   torsemide (DEMADEX) 20 MG tablet Take 1 tablet (20 mg total) by mouth daily. 30 tablet 6   No current facility-administered medications for this encounter.   Allergies  Allergen Reactions   Codeine Nausea Only   Social History   Socioeconomic History   Marital status: Widowed    Spouse name: Not on file   Number of children: Not on file   Years of education: Not on file   Highest education level: Not on file  Occupational History   Not on file  Tobacco Use   Smoking status: Former    Packs/day: 1.00    Years: 15.00    Pack years: 15.00    Types: Cigarettes    Start date: 08/16/1993    Quit date: 08/16/2000    Years since quitting: 20.7   Smokeless tobacco: Never  Substance and Sexual Activity   Alcohol use: Not Currently   Drug use: Not Currently   Sexual activity: Not Currently  Other Topics Concern   Not on file  Social History Narrative   Not on file   Social Determinants of Health   Financial Resource Strain: Medium Risk   Difficulty of Paying Living Expenses: Somewhat hard  Food Insecurity: Food Insecurity Present   Worried About Running Out of Food in the Last Year: Sometimes true   Ran Out of Food in the Last Year: Never true  Transportation Needs: No Transportation Needs   Lack of Transportation (Medical): No   Lack of Transportation (Non-Medical): No  Physical Activity: Not on file  Stress: Not on file  Social Connections: Not on file  Intimate Partner Violence: Not on file    History reviewed. No pertinent family history.  BP 102/60   Pulse (!) 55   Wt 76.8 kg (169 lb 6.4 oz)   SpO2 97%   BMI 30.01 kg/m   Wt Readings from Last 3 Encounters:  05/08/21 76.8 kg (169 lb 6.4  oz)  04/22/21 77.5 kg (170 lb 12.8 oz)  03/25/21 80.6 kg (177 lb 12.8 oz)   PHYSICAL EXAM: General:  NAD. No resp difficulty HEENT: Normal Neck: Supple. No JVD. Carotids 2+ bilat; no bruits. No lymphadenopathy or thryomegaly appreciated. Cor: PMI nondisplaced. Regular rate & rhythm. No rubs, gallops or murmurs. Lungs: Clear Abdomen: Soft, nontender, nondistended. No hepatosplenomegaly. No bruits or masses. Good bowel sounds. Extremities: No cyanosis, clubbing, rash, edema Neuro: Alert & oriented x 3, cranial nerves grossly intact. Moves all 4 extremities w/o difficulty. Affect pleasant.  ASSESSMENT & PLAN: 1. CAD: s/p CABG x 4 (4/22).  Cath in 7/22 with 3/4 grafts patent, occluded SVG-small D.  Native vessels, however, were small and diseased (diabetic coronaries). No chest pain.    - Continue ASA 81 + Plavix. -  Continue atorvastatin, lipids ok (7/22). 2. Systolic CHF: Ischemic cardiomyopathy.  Echo at [facility] with EF 25-30%, moderate MR.  She was admitted to [facility] with CHF, started on inotropes and diuresed.  RHC showed elevated filling pressures and preserved CO (but was on inotropes at the time).  Unable to wean off inotropes successfully, sent to [facility] for consideration of LVAD. Able to wean off dobutamine and milrinone successfully.  CPX showed mild HF limitation, we discussed these results today. NYHA II-early III, she is not volume overloaded today.  - No beta blocker with bradycardia and marginal BP. - Continue Farxiga 10 mg daily. No GU symptoms. - Continue spironolactone 25 daily.  BMET today. - Continue digoxin 0.0625 daily. Check dig level today. - Continue torsemide 20 mg daily. - Continue losartan 25 mg at bed time. No room for Entresto. - Hopefuly can control with meds, but she appears to be a candidate for LVAD or OHT if needed. Blood Type O+ - Continue CR. 3. Type 2 DM: HgbA1c 7.6.   - Continue Farxiga. 4. Fe deficiency anemia: She had Feraheme inpatient. May be  contributory to some of her symptoms.  - Check CBC today.  Follow up with Dr. Shirlee Latch + echo in 2 months.  Anderson [place], FNP 05/08/21

## 2021-05-08 ENCOUNTER — Other Ambulatory Visit: Payer: Self-pay

## 2021-05-08 ENCOUNTER — Encounter (HOSPITAL_COMMUNITY): Payer: Self-pay

## 2021-05-08 ENCOUNTER — Ambulatory Visit (HOSPITAL_COMMUNITY)
Admission: RE | Admit: 2021-05-08 | Discharge: 2021-05-08 | Disposition: A | Discharge status: Home / Self Care | Payer: 59 | Source: Ambulatory Visit | Attending: Family Medicine | Admitting: Family Medicine

## 2021-05-08 VITALS — BP 102/60 | HR 55 | Wt 169.4 lb

## 2021-05-08 DIAGNOSIS — Z794 Long term (current) use of insulin: Secondary | ICD-10-CM | POA: Diagnosis not present

## 2021-05-08 DIAGNOSIS — Q250 Patent ductus arteriosus: Secondary | ICD-10-CM | POA: Diagnosis not present

## 2021-05-08 DIAGNOSIS — F419 Anxiety disorder, unspecified: Secondary | ICD-10-CM | POA: Insufficient documentation

## 2021-05-08 DIAGNOSIS — I5022 Chronic systolic (congestive) heart failure: Secondary | ICD-10-CM

## 2021-05-08 DIAGNOSIS — Z09 Encounter for follow-up examination after completed treatment for conditions other than malignant neoplasm: Secondary | ICD-10-CM | POA: Diagnosis not present

## 2021-05-08 DIAGNOSIS — Z79899 Other long term (current) drug therapy: Secondary | ICD-10-CM | POA: Diagnosis not present

## 2021-05-08 DIAGNOSIS — I340 Nonrheumatic mitral (valve) insufficiency: Secondary | ICD-10-CM | POA: Diagnosis not present

## 2021-05-08 DIAGNOSIS — R079 Chest pain, unspecified: Secondary | ICD-10-CM | POA: Insufficient documentation

## 2021-05-08 DIAGNOSIS — Z87891 Personal history of nicotine dependence: Secondary | ICD-10-CM | POA: Diagnosis not present

## 2021-05-08 DIAGNOSIS — I5042 Chronic combined systolic (congestive) and diastolic (congestive) heart failure: Secondary | ICD-10-CM | POA: Diagnosis not present

## 2021-05-08 DIAGNOSIS — E1122 Type 2 diabetes mellitus with diabetic chronic kidney disease: Secondary | ICD-10-CM | POA: Insufficient documentation

## 2021-05-08 DIAGNOSIS — Z7901 Long term (current) use of anticoagulants: Secondary | ICD-10-CM | POA: Diagnosis not present

## 2021-05-08 DIAGNOSIS — Z7902 Long term (current) use of antithrombotics/antiplatelets: Secondary | ICD-10-CM | POA: Insufficient documentation

## 2021-05-08 DIAGNOSIS — D509 Iron deficiency anemia, unspecified: Secondary | ICD-10-CM | POA: Diagnosis not present

## 2021-05-08 DIAGNOSIS — I2510 Atherosclerotic heart disease of native coronary artery without angina pectoris: Secondary | ICD-10-CM | POA: Diagnosis not present

## 2021-05-08 DIAGNOSIS — N183 Chronic kidney disease, stage 3 unspecified: Secondary | ICD-10-CM | POA: Diagnosis not present

## 2021-05-08 DIAGNOSIS — R0602 Shortness of breath: Secondary | ICD-10-CM | POA: Diagnosis present

## 2021-05-08 DIAGNOSIS — I255 Ischemic cardiomyopathy: Secondary | ICD-10-CM | POA: Diagnosis not present

## 2021-05-08 DIAGNOSIS — E119 Type 2 diabetes mellitus without complications: Secondary | ICD-10-CM

## 2021-05-08 DIAGNOSIS — I25810 Atherosclerosis of coronary artery bypass graft(s) without angina pectoris: Secondary | ICD-10-CM | POA: Diagnosis not present

## 2021-05-08 DIAGNOSIS — Z7982 Long term (current) use of aspirin: Secondary | ICD-10-CM | POA: Diagnosis not present

## 2021-05-08 DIAGNOSIS — F32A Depression, unspecified: Secondary | ICD-10-CM | POA: Diagnosis not present

## 2021-05-08 LAB — BASIC METABOLIC PANEL
Anion gap: 9 (ref 5–15)
BUN: 31 mg/dL — ABNORMAL HIGH (ref 8–23)
CO2: 30 mmol/L (ref 22–32)
Calcium: 9.8 mg/dL (ref 8.9–10.3)
Chloride: 95 mmol/L — ABNORMAL LOW (ref 98–111)
Creatinine, Ser: 1.27 mg/dL — ABNORMAL HIGH (ref 0.44–1.00)
GFR, Estimated: 48 mL/min — ABNORMAL LOW (ref >60)
Glucose, Bld: 141 mg/dL — ABNORMAL HIGH (ref 70–99)
Potassium: 4.6 mmol/L (ref 3.5–5.1)
Sodium: 134 mmol/L — ABNORMAL LOW (ref 135–145)

## 2021-05-08 LAB — DIGOXIN LEVEL: Digoxin Level: 0.6 ng/mL — ABNORMAL LOW (ref 0.8–2.0)

## 2021-05-08 LAB — CBC
HCT: 40.7 % (ref 36.0–46.0)
Hemoglobin: 13 g/dL (ref 12.0–15.0)
MCH: 27.4 pg (ref 26.0–34.0)
MCHC: 31.9 g/dL (ref 30.0–36.0)
MCV: 85.7 fL (ref 80.0–100.0)
Platelets: 157 10*3/uL (ref 150–400)
RBC: 4.75 MIL/uL (ref 3.87–5.11)
RDW: 17 % — ABNORMAL HIGH (ref 11.5–15.5)
WBC: 8.1 10*3/uL (ref 4.0–10.5)
nRBC: 0 % (ref 0.0–0.2)

## 2021-05-08 NOTE — Patient Instructions (Signed)
Labs done today. We will contact you only if your labs are abnormal.  No medication changes were made. Please continue all current medications as prescribed.  Your physician recommends that you schedule a follow-up appointment in: 2 months with Dr. Shirlee Latch with an echo prior to your exam.  Your physician has requested that you have an echocardiogram. Echocardiography is a painless test that uses sound waves to create images of your heart. It provides your doctor with information about the size and shape of your heart and how well your heart's chambers and valves are working. This procedure takes approximately one hour. There are no restrictions for this procedure.  If you have any questions or concerns before your next appointment please send [place] a message through [place] or call our office at [phone].    TO LEAVE A MESSAGE FOR THE NURSE SELECT OPTION 2, PLEASE LEAVE A MESSAGE INCLUDING: YOUR NAME DATE OF BIRTH CALL BACK NUMBER REASON FOR CALL**this is important as we prioritize the call backs  YOU WILL RECEIVE A CALL BACK THE SAME DAY AS LONG AS YOU CALL BEFORE 4:00 PM   Do the following things EVERYDAY: Weigh yourself in the morning before breakfast. Write it down and keep it in a log. Take your medicines as prescribed Eat low salt foods--Limit salt (sodium) to 2000 mg per day.  Stay as active as you can everyday Limit all fluids for the day to less than 2 liters   At the Advanced Heart Failure Clinic, you and your health needs are our priority. As part of our continuing mission to provide you with exceptional heart care, we have created designated Provider Care Teams. These Care Teams include your primary Cardiologist (physician) and Advanced Practice Providers (APPs- Physician Assistants and Nurse Practitioners) who all work together to provide you with the care you need, when you need it.   You may see any of the following providers on your designated Care Team at your next  follow up: Dr Arvilla Meres Dr Carron Curie, NP Robbie Lis, [place] Karle Plumber, PharmD   Please be sure to bring in all your medications bottles to every appointment. Marland Kitchen

## 2021-06-02 ENCOUNTER — Emergency Department (HOSPITAL_COMMUNITY): Payer: 59

## 2021-06-02 ENCOUNTER — Observation Stay (HOSPITAL_COMMUNITY)
Admission: EM | Admit: 2021-06-02 | Discharge: 2021-06-03 | Disposition: A | Discharge status: Home / Self Care | Payer: 59 | Attending: Family Medicine | Admitting: Family Medicine

## 2021-06-02 ENCOUNTER — Encounter (HOSPITAL_COMMUNITY): Payer: Self-pay | Admitting: Pharmacy Technician

## 2021-06-02 DIAGNOSIS — E1122 Type 2 diabetes mellitus with diabetic chronic kidney disease: Secondary | ICD-10-CM | POA: Diagnosis not present

## 2021-06-02 DIAGNOSIS — R0789 Other chest pain: Principal | ICD-10-CM | POA: Insufficient documentation

## 2021-06-02 DIAGNOSIS — R9431 Abnormal electrocardiogram [ECG] [EKG]: Secondary | ICD-10-CM

## 2021-06-02 DIAGNOSIS — Z87891 Personal history of nicotine dependence: Secondary | ICD-10-CM | POA: Insufficient documentation

## 2021-06-02 DIAGNOSIS — Z7982 Long term (current) use of aspirin: Secondary | ICD-10-CM | POA: Diagnosis not present

## 2021-06-02 DIAGNOSIS — I5043 Acute on chronic combined systolic (congestive) and diastolic (congestive) heart failure: Secondary | ICD-10-CM | POA: Insufficient documentation

## 2021-06-02 DIAGNOSIS — Z794 Long term (current) use of insulin: Secondary | ICD-10-CM | POA: Insufficient documentation

## 2021-06-02 DIAGNOSIS — Z951 Presence of aortocoronary bypass graft: Secondary | ICD-10-CM | POA: Insufficient documentation

## 2021-06-02 DIAGNOSIS — I2510 Atherosclerotic heart disease of native coronary artery without angina pectoris: Secondary | ICD-10-CM | POA: Insufficient documentation

## 2021-06-02 DIAGNOSIS — N183 Chronic kidney disease, stage 3 unspecified: Secondary | ICD-10-CM | POA: Insufficient documentation

## 2021-06-02 DIAGNOSIS — R079 Chest pain, unspecified: Secondary | ICD-10-CM

## 2021-06-02 DIAGNOSIS — Z20822 Contact with and (suspected) exposure to covid-19: Secondary | ICD-10-CM | POA: Diagnosis not present

## 2021-06-02 LAB — BASIC METABOLIC PANEL
Anion gap: 9 (ref 5–15)
BUN: 31 mg/dL — ABNORMAL HIGH (ref 8–23)
CO2: 30 mmol/L (ref 22–32)
Calcium: 9.6 mg/dL (ref 8.9–10.3)
Chloride: 98 mmol/L (ref 98–111)
Creatinine, Ser: 1.25 mg/dL — ABNORMAL HIGH (ref 0.44–1.00)
GFR, Estimated: 48 mL/min — ABNORMAL LOW (ref >60)
Glucose, Bld: 188 mg/dL — ABNORMAL HIGH (ref 70–99)
Potassium: 4.9 mmol/L (ref 3.5–5.1)
Sodium: 137 mmol/L (ref 135–145)

## 2021-06-02 LAB — CBC
HCT: 40.8 % (ref 36.0–46.0)
Hemoglobin: 13.1 g/dL (ref 12.0–15.0)
MCH: 27.6 pg (ref 26.0–34.0)
MCHC: 32.1 g/dL (ref 30.0–36.0)
MCV: 86.1 fL (ref 80.0–100.0)
Platelets: 152 10*3/uL (ref 150–400)
RBC: 4.74 MIL/uL (ref 3.87–5.11)
RDW: 16.1 % — ABNORMAL HIGH (ref 11.5–15.5)
WBC: 8.8 10*3/uL (ref 4.0–10.5)
nRBC: 0 % (ref 0.0–0.2)

## 2021-06-02 LAB — BRAIN NATRIURETIC PEPTIDE: B Natriuretic Peptide: 234.8 pg/mL — ABNORMAL HIGH (ref 0.0–100.0)

## 2021-06-02 LAB — TROPONIN I (HIGH SENSITIVITY)
Troponin I (High Sensitivity): 15 ng/L (ref <18)
Troponin I (High Sensitivity): 17 ng/L (ref <18)

## 2021-06-02 NOTE — ED Provider Notes (Signed)
Emergency Medicine Provider Triage Evaluation Note  Aziza Stuckert , a 65 y.o. female  was evaluated in triage.  Pt complains of chest pain radiating to her neck that began over the weekend.  Patient with a past medical history of congestive heart failure.  Review of Systems  Positive: Chest tightness Negative: Shortness of breath  Physical Exam  BP (!) 107/59 (BP Location: Right Arm)   Pulse (!) 58   Temp 98 F (36.7 C) (Oral)   Resp 18   SpO2 100%  Gen:   Awake, no distress   Resp:  Normal effort  MSK:   Moves extremities without difficulty  Other:  Brady  Medical Decision Making  Medically screening exam initiated at 2:38 PM.  Appropriate orders placed.  Jemila Camille was informed that the remainder of the evaluation will be completed by another provider, this initial triage assessment does not replace that evaluation, and the importance of remaining in the ED until their evaluation is complete.     Saddie Benders, PA-C 06/02/21 1441    Mancel Bale, MD 06/02/21 [phone]

## 2021-06-02 NOTE — ED Triage Notes (Signed)
Pt here with chest tightness radiating into her L neck onset over the weekend. Pt with hx CHF.

## 2021-06-03 ENCOUNTER — Other Ambulatory Visit: Payer: Self-pay

## 2021-06-03 ENCOUNTER — Observation Stay (HOSPITAL_BASED_OUTPATIENT_CLINIC_OR_DEPARTMENT_OTHER): Payer: 59

## 2021-06-03 DIAGNOSIS — I2510 Atherosclerotic heart disease of native coronary artery without angina pectoris: Secondary | ICD-10-CM | POA: Diagnosis not present

## 2021-06-03 DIAGNOSIS — R079 Chest pain, unspecified: Secondary | ICD-10-CM | POA: Diagnosis not present

## 2021-06-03 DIAGNOSIS — E1140 Type 2 diabetes mellitus with diabetic neuropathy, unspecified: Secondary | ICD-10-CM

## 2021-06-03 DIAGNOSIS — I5042 Chronic combined systolic (congestive) and diastolic (congestive) heart failure: Secondary | ICD-10-CM | POA: Diagnosis not present

## 2021-06-03 DIAGNOSIS — R9431 Abnormal electrocardiogram [ECG] [EKG]: Secondary | ICD-10-CM | POA: Diagnosis not present

## 2021-06-03 DIAGNOSIS — Z794 Long term (current) use of insulin: Secondary | ICD-10-CM

## 2021-06-03 DIAGNOSIS — I255 Ischemic cardiomyopathy: Secondary | ICD-10-CM

## 2021-06-03 DIAGNOSIS — Z951 Presence of aortocoronary bypass graft: Secondary | ICD-10-CM

## 2021-06-03 DIAGNOSIS — I25719 Atherosclerosis of autologous vein coronary artery bypass graft(s) with unspecified angina pectoris: Secondary | ICD-10-CM

## 2021-06-03 LAB — COMPREHENSIVE METABOLIC PANEL
ALT: 48 U/L — ABNORMAL HIGH (ref 0–44)
AST: 47 U/L — ABNORMAL HIGH (ref 15–41)
Albumin: 3.8 g/dL (ref 3.5–5.0)
Alkaline Phosphatase: 149 U/L — ABNORMAL HIGH (ref 38–126)
Anion gap: 12 (ref 5–15)
BUN: 32 mg/dL — ABNORMAL HIGH (ref 8–23)
CO2: 25 mmol/L (ref 22–32)
Calcium: 9.7 mg/dL (ref 8.9–10.3)
Chloride: 95 mmol/L — ABNORMAL LOW (ref 98–111)
Creatinine, Ser: 1.3 mg/dL — ABNORMAL HIGH (ref 0.44–1.00)
GFR, Estimated: 46 mL/min — ABNORMAL LOW (ref >60)
Glucose, Bld: 149 mg/dL — ABNORMAL HIGH (ref 70–99)
Potassium: 4 mmol/L (ref 3.5–5.1)
Sodium: 132 mmol/L — ABNORMAL LOW (ref 135–145)
Total Bilirubin: 0.6 mg/dL (ref 0.3–1.2)
Total Protein: 7.5 g/dL (ref 6.5–8.1)

## 2021-06-03 LAB — ECHOCARDIOGRAM COMPLETE
AR max vel: 2 cm2
AV Area VTI: 1.86 cm2
AV Area mean vel: 1.9 cm2
AV Mean grad: 4 mmHg
AV Peak grad: 8.5 mmHg
Ao pk vel: 1.46 m/s
Area-P 1/2: 2.37 cm2
Height: 63 in
S' Lateral: 4 cm
Weight: 2640 oz

## 2021-06-03 LAB — CBG MONITORING, ED
Glucose-Capillary: 125 mg/dL — ABNORMAL HIGH (ref 70–99)
Glucose-Capillary: 135 mg/dL — ABNORMAL HIGH (ref 70–99)

## 2021-06-03 LAB — CBC
HCT: 42.4 % (ref 36.0–46.0)
Hemoglobin: 13.9 g/dL (ref 12.0–15.0)
MCH: 28.1 pg (ref 26.0–34.0)
MCHC: 32.8 g/dL (ref 30.0–36.0)
MCV: 85.8 fL (ref 80.0–100.0)
Platelets: 155 10*3/uL (ref 150–400)
RBC: 4.94 MIL/uL (ref 3.87–5.11)
RDW: 16.2 % — ABNORMAL HIGH (ref 11.5–15.5)
WBC: 10.1 10*3/uL (ref 4.0–10.5)
nRBC: 0 % (ref 0.0–0.2)

## 2021-06-03 LAB — LIPID PANEL
Cholesterol: 90 mg/dL (ref 0–200)
HDL: 34 mg/dL — ABNORMAL LOW (ref >40)
LDL Cholesterol: 26 mg/dL (ref 0–99)
Total CHOL/HDL Ratio: 2.6 RATIO
Triglycerides: 151 mg/dL — ABNORMAL HIGH (ref <150)
VLDL: 30 mg/dL (ref 0–40)

## 2021-06-03 LAB — HEMOGLOBIN A1C
Hgb A1c MFr Bld: 7.2 % — ABNORMAL HIGH (ref 4.8–5.6)
Mean Plasma Glucose: 159.94 mg/dL

## 2021-06-03 LAB — RESP PANEL BY RT-PCR (FLU A&B, COVID) ARPGX2
Influenza A by PCR: NEGATIVE
Influenza B by PCR: NEGATIVE
SARS Coronavirus 2 by RT PCR: NEGATIVE

## 2021-06-03 LAB — MAGNESIUM: Magnesium: 2.4 mg/dL (ref 1.7–2.4)

## 2021-06-03 LAB — DIGOXIN LEVEL: Digoxin Level: 0.5 ng/mL — ABNORMAL LOW (ref 0.8–2.0)

## 2021-06-03 MED ORDER — CLOPIDOGREL BISULFATE 75 MG PO TABS
75.0000 mg | ORAL_TABLET | Freq: Every day | ORAL | Status: DC
  Administered 2021-06-03: 75 mg via ORAL
  Filled 2021-06-03: qty 1

## 2021-06-03 MED ORDER — DAPAGLIFLOZIN PROPANEDIOL 10 MG PO TABS
10.0000 mg | ORAL_TABLET | Freq: Every day | ORAL | Status: DC
  Administered 2021-06-03: 10 mg via ORAL
  Filled 2021-06-03 (×2): qty 1

## 2021-06-03 MED ORDER — TORSEMIDE 20 MG PO TABS
20.0000 mg | ORAL_TABLET | Freq: Every day | ORAL | Status: DC
  Administered 2021-06-03: 20 mg via ORAL
  Filled 2021-06-03: qty 1

## 2021-06-03 MED ORDER — BUSPIRONE HCL 10 MG PO TABS
5.0000 mg | ORAL_TABLET | Freq: Three times a day (TID) | ORAL | Status: DC
  Administered 2021-06-03: 5 mg via ORAL
  Filled 2021-06-03: qty 1

## 2021-06-03 MED ORDER — LOSARTAN POTASSIUM 50 MG PO TABS: 25.0000 mg | ORAL_TABLET | Freq: Every day | ORAL | Status: DC

## 2021-06-03 MED ORDER — MELATONIN 5 MG PO TABS: 5.0000 mg | ORAL_TABLET | Freq: Every day | ORAL | Status: DC

## 2021-06-03 MED ORDER — ASPIRIN EC 81 MG PO TBEC
81.0000 mg | DELAYED_RELEASE_TABLET | Freq: Every day | ORAL | Status: DC
  Administered 2021-06-03: 81 mg via ORAL
  Filled 2021-06-03: qty 1

## 2021-06-03 MED ORDER — ACETAMINOPHEN 325 MG PO TABS: 650.0000 mg | ORAL_TABLET | ORAL | Status: DC | PRN

## 2021-06-03 MED ORDER — INSULIN GLARGINE-YFGN 100 UNIT/ML ~~LOC~~ SOLN
10.0000 [IU] | Freq: Every day | SUBCUTANEOUS | Status: DC
  Administered 2021-06-03: 10 [IU] via SUBCUTANEOUS
  Filled 2021-06-03 (×2): qty 0.1

## 2021-06-03 MED ORDER — INSULIN ASPART 100 UNIT/ML IJ SOLN
0.0000 [IU] | Freq: Three times a day (TID) | INTRAMUSCULAR | Status: DC
  Administered 2021-06-03: 2 [IU] via SUBCUTANEOUS

## 2021-06-03 MED ORDER — ENOXAPARIN SODIUM 40 MG/0.4ML IJ SOSY
40.0000 mg | PREFILLED_SYRINGE | Freq: Every day | INTRAMUSCULAR | Status: DC
  Administered 2021-06-03: 40 mg via SUBCUTANEOUS
  Filled 2021-06-03: qty 0.4

## 2021-06-03 MED ORDER — PERFLUTREN LIPID MICROSPHERE
1.0000 mL | INTRAVENOUS | Status: AC | PRN
  Administered 2021-06-03: 2 mL via INTRAVENOUS
  Filled 2021-06-03: qty 10

## 2021-06-03 MED ORDER — ATORVASTATIN CALCIUM 80 MG PO TABS
80.0000 mg | ORAL_TABLET | Freq: Every day | ORAL | Status: DC
  Administered 2021-06-03: 80 mg via ORAL
  Filled 2021-06-03: qty 2

## 2021-06-03 MED ORDER — SPIRONOLACTONE 25 MG PO TABS
25.0000 mg | ORAL_TABLET | Freq: Every day | ORAL | Status: DC
  Administered 2021-06-03: 25 mg via ORAL
  Filled 2021-06-03 (×2): qty 1

## 2021-06-03 MED ORDER — DIGOXIN 125 MCG PO TABS
0.0625 mg | ORAL_TABLET | Freq: Every day | ORAL | Status: DC
  Administered 2021-06-03: 0.0625 mg via ORAL
  Filled 2021-06-03: qty 1

## 2021-06-03 MED ORDER — SERTRALINE HCL 50 MG PO TABS
50.0000 mg | ORAL_TABLET | Freq: Every day | ORAL | Status: DC
  Administered 2021-06-03: 50 mg via ORAL
  Filled 2021-06-03: qty 1

## 2021-06-03 NOTE — Progress Notes (Signed)
Progress Note  Patient Name: Andrea Mueller Date of Encounter: 06/03/2021  [facility] Cardiologist: Dr. Shirlee Latch  Subjective   No further events of chest pain or neck pain. Slept well. No breathing issues.  Inpatient Medications    Scheduled Meds:  aspirin EC  81 mg Oral Daily   atorvastatin  80 mg Oral Daily   busPIRone  5 mg Oral TID   clopidogrel  75 mg Oral Daily   dapagliflozin propanediol  10 mg Oral Daily   digoxin  0.0625 mg Oral Daily   enoxaparin (LOVENOX) injection  40 mg Subcutaneous Q0600   insulin aspart  0-15 Units Subcutaneous TID WC   insulin glargine-yfgn  10 Units Subcutaneous Daily   losartan  25 mg Oral QHS   melatonin  5 mg Oral QHS   sertraline  50 mg Oral Daily   spironolactone  25 mg Oral Daily   torsemide  20 mg Oral Daily   Continuous Infusions:  PRN Meds: acetaminophen, perflutren lipid microspheres (DEFINITY) IV suspension   Vital Signs    Vitals:   06/03/21 0500 06/03/21 0600 06/03/21 0800 06/03/21 1000  BP: (!) 114/54 (!) 102/52 (!) 121/55 (!) 106/54  Pulse: 61 60 68 63  Resp: 13 13 16  (!) 21  Temp:      TempSrc:      SpO2: 96% 93% 94% 97%  Weight:      Height:       No intake or output data in the 24 hours ending 06/03/21 1202 Last 3 Weights 06/03/2021 05/08/2021 04/22/2021  Weight (lbs) 165 lb 169 lb 6.4 oz 170 lb 12.8 oz  Weight (kg) 74.844 kg 76.839 kg 77.474 kg      Telemetry    NSR - Personally Reviewed  ECG    SR, RBBB, LPFB - Personally Reviewed  Physical Exam   GEN: No acute distress.   Neck: No JVD Cardiac: RRR, no murmurs, rubs, or gallops.  Respiratory: Clear to auscultation bilaterally. GI: Soft, nontender, non-distended  MS: Trivial nonpitting edema; No deformity. Neuro:  Nonfocal  Psych: Normal affect   Labs    High Sensitivity Troponin:   Recent Labs  Lab 06/02/21 1446 06/02/21 1836  TROPONINIHS 15 17     Chemistry Recent Labs  Lab 06/02/21 1446 06/03/21 0235  NA 137 132*  K 4.9 4.0   CL 98 95*  CO2 30 25  GLUCOSE 188* 149*  BUN 31* 32*  CREATININE 1.25* 1.30*  CALCIUM 9.6 9.7  MG  --  2.4  PROT  --  7.5  ALBUMIN  --  3.8  AST  --  47*  ALT  --  48*  ALKPHOS  --  149*  BILITOT  --  0.6  GFRNONAA 48* 46*  ANIONGAP 9 12    Lipids  Recent Labs  Lab 06/03/21 0235  CHOL 90  TRIG 151*  HDL 34*  LDLCALC 26  CHOLHDL 2.6    Hematology Recent Labs  Lab 06/02/21 1446 06/03/21 0235  WBC 8.8 10.1  RBC 4.74 4.94  HGB 13.1 13.9  HCT 40.8 42.4  MCV 86.1 85.8  MCH 27.6 28.1  MCHC 32.1 32.8  RDW 16.1* 16.2*  PLT 152 155   Thyroid No results for input(s): TSH, FREET4 in the last 168 hours.  BNP Recent Labs  Lab 06/02/21 1446  BNP 234.8*    DDimer No results for input(s): DDIMER in the last 168 hours.   Radiology    DG Chest 2 View  Result Date: 06/02/2021 CLINICAL DATA:  Chest pain EXAM: CHEST - 2 VIEW COMPARISON:  03/14/2021 FINDINGS: Post sternotomy changes. No focal opacity or significant pleural effusion. Cardiomediastinal silhouette within normal limits. No pneumothorax. IMPRESSION: No active cardiopulmonary disease. Electronically Signed   By: Jasmine Pang M.D.   On: 06/02/2021 16:02    Cardiac Studies   Echo performed, final read pending  Cath novant 03/04/21 Coronary Angiography  1. Left Main -no significant stenosis  2. Left anterior descending artery -occluded at the midportion of the  vessel  3. Diagonals -diagonal 1 is a very small vessel with diffuse 80 to 90%  stenosis  4. Left Circumflex -occluded at the midportion of the vessel  5. Obtuse Marginals - OM1 is a very small vessel  6. Right Coronary Artery -occluded at the midportion of the vessel    Graft angiography  SVG to the diagonal is subtotally occluded  SVG to OM is patent.  There is 90% stenosis at the anastomosis to the  native vasculature however the vessel was very small (less than 2 mm)  SVG to RCA is widely patent.  The distal right coronary artery and PDA are   small vessels  LIMA to the LAD is widely patent however the LIMA ties into a very small  diffusely diseased distal LAD    Hemodynamics    Of note, the patient came to the Cath Lab on 2.5 mcg/min of dobutamine.    Hemodynamics measurements were performed while on dobutamine.    1. Right atrial pressure mean of 13 mmHg.  2. Right ventricular pressure 55/10 mmHg.  3. Pulmonary artery pressure 55/19 mmHg with a mean of 33 mmHg.  4. Pulmonary capillary wedge pressure 25 mmHg. Large V waves noted  5. Left ventricular pressure 105/56 mmHg.  6. Aortic pressure 105/30 mmHg.   LVEDP ranges from 25 to 30 mmHg  7. Pulmonary artery saturation 59%.  8. Aortic saturation 91.7%.  9. Calculated cardiac output of 5.85 L/minute by Fick with an index of  3.12 L/minute/m2.   Left Ventriculography -not performed   Patient Profile     65 y.o. female hx of CAD s/p 4v CABG (11/2020), type 2 diabetes mellitus, anxiety/depression, CKD III, and recently diagnosed ICM. Presented with episode of chest/neck pain, admitted for further evaluation  Assessment & Plan    Chest/neck pressure -hsTnI unremarkable x2 -no acute ECG changes -symptoms resolved -had recent cath, as above -antianginals limited by hypotension -she has been working with cardiac rehab, activity level improving -we discussed inpatient stress test vs. Outpatient follow up. Given her workup thus far, we elected not to pursue inpatient stress and will instead followup outpatient -will arrange close follow up -reviewed red flag warning signs that need immediate medical attention  [facility] will sign off.   Medication Recommendations:  resume home meds Other recommendations (labs, testing, etc):  potential outpatient nuclear stress based on symptoms Follow up as an outpatient:  We will arrange for follow up with Dr. Shirlee Latch or a member of his team.  For questions or updates, please contact [facility] Please consult [email or web address]  for contact info under        Signed, Jodelle Red, MD  06/03/2021, 12:02 PM

## 2021-06-03 NOTE — ED Provider Notes (Signed)
[facility] EMERGENCY DEPARTMENT Provider Note   CSN: [id number] Arrival date & time: 06/02/21  1420     History Chief Complaint  Patient presents with   Chest Pain    Giovanni Bath is a 65 y.o. female.  Patient with a history of combined CHF, CAD (CABG 4/22, cath 7/22), HTN, HLD presents for evaluation of chest tightness with radiation to the back and left neck that started 3 days ago. She describes the symptoms as exertional, better with rest. She has chronic SOB that she does not feels is any worse with these symptoms. No diaphoresis or nausea. No pain currently. She is followed by the [facility] who recommended she come to the ED for evaluation.   The history is provided by the patient. No language interpreter was used.  Chest Pain Associated symptoms: shortness of breath   Associated symptoms: no fever, no nausea and no weakness       Past Medical History:  Diagnosis Date   CAD (coronary artery disease), native coronary artery    CKD (chronic kidney disease) stage 3, GFR 30-59 ml/min (HCC)    Type 2 diabetes mellitus treated without insulin [facility])     Patient Active Problem List   Diagnosis Date Noted   Chronic combined systolic and diastolic heart failure (HCC) 03/25/2021   Acute on chronic systolic and diastolic heart failure, NYHA class 3 (HCC) 03/14/2021    Past Surgical History:  Procedure Laterality Date   CORONARY ARTERY BYPASS GRAFT       OB History   No obstetric history on file.     No family history on file.  Social History   Tobacco Use   Smoking status: Former    Packs/day: 1.00    Years: 15.00    Pack years: 15.00    Types: Cigarettes    Start date: 08/16/1993    Quit date: 08/16/2000    Years since quitting: 20.8   Smokeless tobacco: Never  Substance Use Topics   Alcohol use: Not Currently   Drug use: Not Currently    Home Medications Prior to Admission medications   Medication Sig Start Date End Date Taking?  Authorizing Provider  acetaminophen (TYLENOL) 325 MG tablet Take 325 mg by mouth every 6 (six) hours as needed for moderate pain or headache.   Yes [provider]  aspirin 81 MG EC tablet Take 1 tablet (81 mg total) by mouth daily. Swallow whole. 03/18/21  Yes Clegg, Amy D, NP  atorvastatin (LIPITOR) 80 MG tablet Take 1 tablet (80 mg total) by mouth daily. 03/18/21  Yes Clegg, Amy D, NP  busPIRone (BUSPAR) 5 MG tablet Take 1 tablet (5 mg total) by mouth 3 (three) times daily. 03/18/21  Yes Clegg, Amy D, NP  clopidogrel (PLAVIX) 75 MG tablet Take 75 mg by mouth daily. 12/23/20  Yes [provider]  dapagliflozin propanediol (FARXIGA) 10 MG TABS tablet Take 1 tablet (10 mg total) by mouth daily. 03/18/21  Yes Clegg, Amy D, NP  digoxin (LANOXIN) 0.125 MG tablet Take 0.5 tablets (0.0625 mg total) by mouth daily. 03/18/21  Yes Clegg, Amy D, NP  insulin NPH-regular Human (70-30) 100 UNIT/ML injection Inject 20 Units into the skin 2 (two) times daily with a meal.   Yes [provider]  LORazepam (ATIVAN) 0.5 MG tablet Take 0.25-0.5 mg by mouth daily as needed for anxiety. 01/14/21  Yes [provider]  losartan (COZAAR) 25 MG tablet Take 1 tablet (25 mg total)  by mouth at bedtime. 03/18/21  Yes Clegg, Amy D, NP  melatonin 5 MG TABS Take 5 mg by mouth at bedtime.   Yes [provider]  Omega-3 Fatty Acids (FISH OIL) 1000 MG CAPS Take 1,000 mg by mouth daily.   Yes [provider]  senna-docusate (SENOKOT-S) 8.6-50 MG tablet Take 1 tablet by mouth daily.   Yes [provider]  sertraline (ZOLOFT) 50 MG tablet Take 1 tablet (50 mg total) by mouth daily. 03/18/21  Yes Clegg, Amy D, NP  spironolactone (ALDACTONE) 25 MG tablet Take 1 tablet (25 mg total) by mouth daily. 03/18/21  Yes Clegg, Amy D, NP  torsemide (DEMADEX) 20 MG tablet Take 1 tablet (20 mg total) by mouth daily. 03/18/21  Yes Clegg, Amy D, NP  cephALEXin (KEFLEX) 500 MG capsule Take 500 mg by mouth See  admin instructions. Tid x 5 days Patient not taking: No sig reported 05/18/21   [provider]    Allergies    Statins and Codeine  Review of Systems   Review of Systems  Constitutional:  Negative for chills and fever.  HENT: Negative.    Respiratory:  Positive for shortness of breath.   Cardiovascular:  Positive for chest pain. Negative for leg swelling.  Gastrointestinal: Negative.  Negative for nausea.  Musculoskeletal: Negative.   Skin: Negative.   Neurological: Negative.  Negative for weakness.   Physical Exam Updated Vital Signs BP (!) 137/55   Pulse 62   Temp 97.9 F (36.6 C) (Oral)   Resp 16   Ht 5\' 3"  (1.6 m)   Wt 74.8 kg   SpO2 100%   BMI 29.23 kg/m   Physical Exam Vitals and nursing note reviewed.  Constitutional:      General: She is not in acute distress.    Appearance: She is well-developed.  HENT:     Head: Normocephalic.  Neck:     Vascular: No carotid bruit.  Cardiovascular:     Rate and Rhythm: Normal rate and regular rhythm.     Heart sounds: No murmur heard. Pulmonary:     Effort: Pulmonary effort is normal.     Breath sounds: Normal breath sounds. No wheezing, rhonchi or rales.  Abdominal:     General: Bowel sounds are normal.     Palpations: Abdomen is soft.     Tenderness: There is no abdominal tenderness. There is no guarding or rebound.  Musculoskeletal:        General: Normal range of motion.     Cervical back: Normal range of motion and neck supple.     Right lower leg: No edema.     Left lower leg: No edema.  Skin:    General: Skin is warm and dry.  Neurological:     General: No focal deficit present.     Mental Status: She is alert and oriented to person, place, and time.    ED Results / Procedures / Treatments   Labs (all labs ordered are listed, but only abnormal results are displayed) Labs Reviewed  BASIC METABOLIC PANEL - Abnormal; Notable for the following components:      Result Value   Glucose, Bld 188  (*)    BUN 31 (*)    Creatinine, Ser 1.25 (*)    GFR, Estimated 48 (*)    All other components within normal limits  CBC - Abnormal; Notable for the following components:   RDW 16.1 (*)    All other components within normal  limits  BRAIN NATRIURETIC PEPTIDE - Abnormal; Notable for the following components:   B Natriuretic Peptide 234.8 (*)    All other components within normal limits  RESP PANEL BY RT-PCR (FLU A&B, COVID) ARPGX2  TROPONIN I (HIGH SENSITIVITY)  TROPONIN I (HIGH SENSITIVITY)    EKG None  Radiology DG Chest 2 View  Result Date: 06/02/2021 CLINICAL DATA:  Chest pain EXAM: CHEST - 2 VIEW COMPARISON:  03/14/2021 FINDINGS: Post sternotomy changes. No focal opacity or significant pleural effusion. Cardiomediastinal silhouette within normal limits. No pneumothorax. IMPRESSION: No active cardiopulmonary disease. Electronically Signed   By: Jasmine Pang M.D.   On: 06/02/2021 16:02    Procedures Procedures   Medications Ordered in ED Medications - No data to display  ED Course  I have reviewed the triage vital signs and the nursing notes.  Pertinent labs & imaging results that were available during my care of the patient were reviewed by me and considered in my medical decision making (see chart for details).    MDM Rules/Calculators/A&P                           Patient to ED with ss/sxs as per HPI.   She is asymptomatic now. Symptoms of radiating chest tightness are exertional. Troponin x 2 negative. No electrolyte abnormalities. EKG shows biphasic T waves and new ST depression in lateral leads when compared to EKG 2 months prior.   Findings and presentation were discussed with cardiology. Will admit to medicine, chest pain rule out, with cardiology to see in consultation.   Final Clinical Impression(s) / ED Diagnoses Final diagnoses:  None   Chest pain EKG changes  Rx / DC Orders ED Discharge Orders     None        Elpidio Anis, PA-C 06/03/21  0144    Sabas Sous, MD 06/03/21 [phone]

## 2021-06-03 NOTE — Discharge Summary (Addendum)
Family Medicine Teaching [facility] Discharge Summary  Patient name: Andrea Mueller Medical record number: [id number] Date of birth: 1956-05-19 Age: 65 y.o. Gender: female Date of Admission: 06/02/2021       Date of Discharge: 06/03/21 Admitting Physician: Katha Cabal, DO  Primary Care Provider: Jeanice Lim, PA-C Consultants: Cardiology  Indication for Hospitalization: Unstable angina   Discharge Diagnoses/Problem List:  Active Problems:   Chest pain   Disposition: Home  Discharge Condition: Stable   Discharge Exam:  General: pt lying in bed, NAD Cardio: RRR, no murmurs Respiratory: CTA B, normal work of breathing Abdomen: Soft, nontender to palpation, nondistended, normal bowel sounds Extremities: Trace pitting edema of BLEs   Brief Hospital Course:  Andrea Mueller is a 65 y.o. female presenting with chest tightness . PMH is significant for recent CABG, coronary artery disease, STEMI, type 2 diabetes, hypertension and anxiety  Patient presented with worsening exertional chest tightness with radiation to the left neck for 3 days.  Initial troponin 15, repeat 17.   Initial EKG with sinus bradycardia and ST depressions in the lateral leads with questionable biphasic P waves.  Chest x-ray was unremarkable.  With BNP 230s, previously 750, did not appear to be heart failure exacerbation.   Given patient's recent CABG and reported narrowing of one of her cardiac vessels, she was admitted for chest pain work-up.    Cardiology evaluated pt and since she had no acute ECG changes, troponins were unremarkable, her symptoms resolved, they, with the patient, elected to pursue outpatient follow-up.  All of her home medications for CAD, heart failure, hypertension, and anxiety were continued. For her DM, she received 10 units Lantus, moderate SSI, and [place].   Issues for follow-up 1.  Ensure close follow-up with cardiology    Significant Labs and Imaging:  Recent Labs   Lab 06/02/21 1446 06/03/21 0235  WBC 8.8 10.1  HGB 13.1 13.9  HCT 40.8 42.4  PLT 152 155   Recent Labs  Lab 06/02/21 1446 06/03/21 0235  NA 137 132*  K 4.9 4.0  CL 98 95*  CO2 30 25  GLUCOSE 188* 149*  BUN 31* 32*  CREATININE 1.25* 1.30*  CALCIUM 9.6 9.7  MG  --  2.4  ALKPHOS  --  149*  AST  --  47*  ALT  --  48*  ALBUMIN  --  3.8      Discharge Medications:  Allergies as of 06/03/2021       Reactions   Statins    Other reaction(s): Other Myalgia   Codeine Nausea Only        Medication List     TAKE these medications    acetaminophen 325 MG tablet Commonly known as: TYLENOL Take 325 mg by mouth every 6 (six) hours as needed for moderate pain or headache.   aspirin 81 MG EC tablet Take 1 tablet (81 mg total) by mouth daily. Swallow whole.   atorvastatin 80 MG tablet Commonly known as: LIPITOR Take 1 tablet (80 mg total) by mouth daily.   busPIRone 5 MG tablet Commonly known as: BUSPAR Take 1 tablet (5 mg total) by mouth 3 (three) times daily.   clopidogrel 75 MG tablet Commonly known as: PLAVIX Take 75 mg by mouth daily.   dapagliflozin propanediol 10 MG Tabs tablet Commonly known as: FARXIGA Take 1 tablet (10 mg total) by mouth daily.   digoxin 0.125 MG tablet Commonly known as: LANOXIN Take 0.5 tablets (0.0625 mg total) by mouth daily.  Fish Oil 1000 MG Caps Take 1,000 mg by mouth daily.   insulin NPH-regular Human (70-30) 100 UNIT/ML injection Inject 20 Units into the skin 2 (two) times daily with a meal.   LORazepam 0.5 MG tablet Commonly known as: ATIVAN Take 0.25-0.5 mg by mouth daily as needed for anxiety.   losartan 25 MG tablet Commonly known as: COZAAR Take 1 tablet (25 mg total) by mouth at bedtime.   melatonin 5 MG Tabs Take 5 mg by mouth at bedtime.   senna-docusate 8.6-50 MG tablet Commonly known as: Senokot-S Take 1 tablet by mouth daily.   sertraline 50 MG tablet Commonly known as: ZOLOFT Take 1 tablet  (50 mg total) by mouth daily.   spironolactone 25 MG tablet Commonly known as: ALDACTONE Take 1 tablet (25 mg total) by mouth daily.   torsemide 20 MG tablet Commonly known as: DEMADEX Take 1 tablet (20 mg total) by mouth daily.        Discharge Instructions: Please refer to Patient Instructions section of EMR for full details.  Patient was counseled important signs and symptoms that should prompt return to medical care, changes in medications, dietary instructions, activity restrictions, and follow up appointments.   Follow-Up Appointments:  Follow-up Information     Laurey Morale, MD Follow up.   Specialty: Cardiology Why: They will call you with an appointment date and time Contact information: [address] [place] [address] [place] [address] [phone]         Jeanice Lim, PA-C. Schedule an appointment as soon as possible for a visit in 1 week(s).   Contact information: [address] [place] [address] [phone]                 Erick Alley, DO 06/03/2021, 4:31 PM PGY-1, [facility]   FPTS Upper-Level Resident Addendum   I have discussed the above with Dr. Yetta Barre and agree with the documented plan. My edits for correction/addition/clarification are included above. Please see any attending notes.   Maury Dus, MD PGY-2, [facility]

## 2021-06-03 NOTE — Discharge Instructions (Signed)
Dear Elberta Fortis,   Thank you for letting us participate in your care! In this section, you will find a brief summary of why you were admitted to the hospital, what happened during your admission, your diagnosis/diagnoses, and recommended follow up.  You were admitted because you were experiencing worsening chest pain.   Your testing was overall reassuring. You were also seen by the cardiology team. They recommended additional evaluation as an outpatient.   POST-HOSPITAL & CARE INSTRUCTIONS Please follow up with your cardiologist  DOCTOR'S APPOINTMENTS & FOLLOW UP Future Appointments  Date Time Provider Department Center  08/13/2021 10:00 AM [facility] ECHO OP 1 [facility]  08/13/2021 11:00 AM Laurey Morale, MD [facility] None     Thank you for choosing [facility]! Take care and be well!  Family Medicine Teaching Service Inpatient Team [place]  [facility]  [address] [place], [place] [address] [phone]

## 2021-06-03 NOTE — Hospital Course (Signed)
Andrea Mueller is a 65 y.o. female presenting with chest tightness . PMH is significant for recent CABG, coronary artery disease, STEMI, type 2 diabetes, hypertension and anxiety  Patient presented with worsening exertional chest tightness with radiation to the left neck for 3 days.  Initial troponin 15, repeat 17.   Initial EKG with sinus bradycardia and ST depressions in the lateral leads with questionable biphasic P waves.  Chest x-ray was unremarkable.  With BNP 230s, previously 750, did not appear to be heart failure exacerbation.   Given patient's recent CABG and reported narrowing of one of her cardiac vessels, she was admitted for chest pain work-up.    Cardiology evaluated pt and since she had no acute ECG changes, troponins were unremarkable, her symptoms resolved, they, with the patient, elected to pursue outpatient follow-up.  All of her home medications for CAD, heart failure, hypertension, and anxiety were continued. For her DM, she received 10 units Lantus, moderate SSI, and [place].   Issues for follow-up 1.  Ensure close follow-up with cardiology

## 2021-06-03 NOTE — H&P (Signed)
Family Medicine Teaching [facility] Admission History and Physical Service Pager: [phone]  Patient name: Andrea Mueller Medical record number: [id number] Date of birth: 06/27/1956 Age: 65 y.o. Gender: female  Primary Care Provider: Jeanice Lim, PA-C at [facility] in [place]  Consultants: Cardiology Code Status: FULL Preferred Emergency Contact: Berry, Gallacher (Son)  [phone] (Mobile)  Chief Complaint: chest tightness   Assessment and Plan: Andrea Mueller is a 64 y.o. female presenting with chest tightness . PMH is significant for recent CABG, coronary artery disease, STEMI, type 2 diabetes, hypertension and anxiety   Atypical Chest Pain  ACS rule out  Patient presents with worsening exertional chest tightness with radiation to the left neck for the past 3 days.  Initial troponin 15, repeat 17.  Patient with symptoms are suspicious for ACS. Initial EKG with sinus bradycardia and ST depressions in the lateral leads with questionable biphasic P waves.  Chest x-ray was unremarkable.  With BNP 230s, previously 750 I doubt acute heart failure exacerbation.   Given patient's recent CABG and reported narrowing of one of her cardiac vessels, she was admitted for chest pain work-up.  Unlikely GERD as pain not related to food.  Obtain further work up and cardiology consult.  - Admit to cardiac telemetry,  attending Dr. Lum Babe - Consult cardiology consult in AM  - Follow up ECHO  - AM CBC, BMP  - AM EKG  - Tylenol 650 mg as needed - 81 mg ASA daily - Obtain lipid panel  - Heart healthy carb modified  - Lovenox - Cardiopulmonary monitoring  - Lipitor 80 mg - Follow-up a.m. lipid panel   CAD  CABG  congestive heart failure In [place] she had a heart attack and had a quadruple bypass in early May 2022.  States that she had a leg infection and had to be hospitalized again at [facility].  She has been attending cardiac rehab 3 times a week in [place].  Notes that  she was told her heart was getting weaker.  Says her EF went from 50s to 30s. She was referred to the advanced heart failure clinic with Dr. Shirlee Latch. Previously followed with [facility] cardiologist however due to her declining heart function she was referred to the advanced heart failure clinic.  She attends cardiac rehab 3 times a week., cardiology.  Home medications include aspirin 81 mg daily, Lipitor 80 mg daily, Plavix 75 mg daily, Digoxin 0.0625 daily, Aldactone 25 mg daily, Torsemide 20 mg daily. -Consult cardiology in a.m. -Continue home medications  -Follow-up digoxin level and lipid panel -Continuous cardiac monitoring   Type 2 Diabetes mellitus Blood sugars at home have been 109-122 fasting.  Glucose on admission.  Home regimen includes Farxiga 10 mg daily, 70/30 Insulin 20u BID.  Last A1c was 7.0.  Admission A1c 7.2. -Moderate sliding scale insulin -10 units Lantus daily -Continue Home Farxiga    Hypertension Blood pressures since admission have ranged between 107/59-137/55.  Home meds include  Losartan 25 mg daily.  Takes torsemide, digoxin and Aldactone as above - Continue home meds - Monitor blood pressures  Anxiety  Stable.  Home medications include Zoloft 50 mg daily, Buspar 5 mg TID and 0.25-0.5 mg Ativan PRN.  -Continue home Zoloft and BuSpar  History of tobacco use Patient is a former smoking who smoked 1/2-1 ppd for about 20 years. Quit 20 years ago.  Difficulty Sleeping  Stable.  Home medications include melatonin 5 mg at bedtime. -Continue home medication   FEN/GI: Heart healthy carb modified  diet Prophylaxis: Lovenox  Disposition: Admit for observation in cardiac telemetry  History of Present Illness:  Andrea Mueller is a 65 y.o. female presenting with chest tighness since this past weekend.   Patient reports increasing chest tightness with exertion.  Sometimes this radiates to her left neck.  Previously this would come and go however now it is becoming more  frequent and lasting longer.  She is having to rest in order for it to go away.  She attends cardiac rehab 3 times a week following her recent CABG.  On Monday, she expressed concern about chest tightness and they told her to be evaluated in the ED as they were concerned she may be having another heart attack.  Says that her blood pressures were somewhat low during that time.  Chest pain described as " somebody wrapping or binding you or having on a really tight bra."    Walking from the waiting area to her ED room, she notes that she had to stop once for a minute or 2 to catch her breath.  He has been getting short of breath when she walks a lot.  He sleeps on 2 pillows at night and cannot lie flat.  Denies paroxysmal nighttime dyspnea.  He quit smoking 20 years ago.  Troponins were flat in the ED and chest x-ray was unremarkable.  ED provider contacted cardiology who will consult in the morning.   Review Of Systems: Per HPI with the following additions:   Review of Systems  Constitutional:  Negative for chills and fever.  HENT:  Negative for sore throat.   Eyes:        No vision changes  Respiratory:  Negative for cough and wheezing.   Cardiovascular:  Positive for chest pain and orthopnea. Negative for palpitations, leg swelling and PND.  Gastrointestinal:  Negative for abdominal pain, nausea and vomiting.  Genitourinary:  Negative for dysuria.  Musculoskeletal:  Negative for falls.  Skin:  Negative for rash.  Neurological:  Negative for tingling, weakness and headaches.   Patient Active Problem List   Diagnosis Date Noted   Chronic combined systolic and diastolic heart failure (HCC) 03/25/2021   Acute on chronic systolic and diastolic heart failure, NYHA class 3 (HCC) 03/14/2021    Past Medical History: Past Medical History:  Diagnosis Date   CAD (coronary artery disease), native coronary artery    CKD (chronic kidney disease) stage 3, GFR 30-59 ml/min (HCC)    Type 2 diabetes  mellitus treated without insulin (HCC)     Past Surgical History: Past Surgical History:  Procedure Laterality Date   CORONARY ARTERY BYPASS GRAFT      Social History: Social History   Tobacco Use   Smoking status: Former    Packs/day: 1.00    Years: 15.00    Pack years: 15.00    Types: Cigarettes    Start date: 08/16/1993    Quit date: 08/16/2000    Years since quitting: 20.8   Smokeless tobacco: Never  Substance Use Topics   Alcohol use: Not Currently   Drug use: Not Currently   Additional social history: Her daughter lives with her.  Please also refer to relevant sections of EMR.  Family History: No family history on file. No family hx of heart disease.  Has two a maternal aunts that required a pacemaker.   Allergies and Medications: Allergies  Allergen Reactions   Statins     Other reaction(s): Other Myalgia   Codeine Nausea Only  No current facility-administered medications on file prior to encounter.   Current Outpatient Medications on File Prior to Encounter  Medication Sig Dispense Refill   acetaminophen (TYLENOL) 325 MG tablet Take 325 mg by mouth every 6 (six) hours as needed for moderate pain or headache.     aspirin 81 MG EC tablet Take 1 tablet (81 mg total) by mouth daily. Swallow whole. 30 tablet 11   atorvastatin (LIPITOR) 80 MG tablet Take 1 tablet (80 mg total) by mouth daily. 30 tablet 6   busPIRone (BUSPAR) 5 MG tablet Take 1 tablet (5 mg total) by mouth 3 (three) times daily. 90 tablet 6   clopidogrel (PLAVIX) 75 MG tablet Take 75 mg by mouth daily.     dapagliflozin propanediol (FARXIGA) 10 MG TABS tablet Take 1 tablet (10 mg total) by mouth daily. 30 tablet 6   digoxin (LANOXIN) 0.125 MG tablet Take 0.5 tablets (0.0625 mg total) by mouth daily. 15 tablet 6   insulin NPH-regular Human (70-30) 100 UNIT/ML injection Inject 20 Units into the skin 2 (two) times daily with a meal.     LORazepam (ATIVAN) 0.5 MG tablet Take 0.25-0.5 mg by mouth daily  as needed for anxiety.     losartan (COZAAR) 25 MG tablet Take 1 tablet (25 mg total) by mouth at bedtime. 30 tablet 6   melatonin 5 MG TABS Take 5 mg by mouth at bedtime.     Omega-3 Fatty Acids (FISH OIL) 1000 MG CAPS Take 1,000 mg by mouth daily.     senna-docusate (SENOKOT-S) 8.6-50 MG tablet Take 1 tablet by mouth daily.     sertraline (ZOLOFT) 50 MG tablet Take 1 tablet (50 mg total) by mouth daily. 30 tablet 6   spironolactone (ALDACTONE) 25 MG tablet Take 1 tablet (25 mg total) by mouth daily. 30 tablet 6   torsemide (DEMADEX) 20 MG tablet Take 1 tablet (20 mg total) by mouth daily. 30 tablet 6   cephALEXin (KEFLEX) 500 MG capsule Take 500 mg by mouth See admin instructions. Tid x 5 days (Patient not taking: No sig reported)      Objective: BP (!) 124/51   Pulse 62   Temp 97.9 F (36.6 C) (Oral)   Resp 20   Ht 5\' 3"  (1.6 m)   Wt 74.8 kg   SpO2 97%   BMI 29.23 kg/m   Exam:  GEN:     alert, well appearing and no distress    HENT:  mucus membranes moist, nares patent, no nasal discharge  EYES:   pupils equal and reactive, EOM intact NECK:  supple, normal ROM RESP:  clear to auscultation bilaterally, no increased work of breathing  CVS:    regular rate and rhythm, distal pulses intact   CHEST: midline surgical scar ABD:  soft, non-tender; bowel sounds present; no palpable masses EXT:   normal ROM, atraumatic, no appreciable edema NEURO:  normal without focal findings,  speech normal, alert and oriented x4 Skin:   warm and dry, well perfused  Psych: Normal affect, appropriate speech and behavior     Labs and Imaging: CBC BMET  Recent Labs  Lab 06/02/21 1446  WBC 8.8  HGB 13.1  HCT 40.8  PLT 152   Recent Labs  Lab 06/02/21 1446  NA 137  K 4.9  CL 98  CO2 30  BUN 31*  CREATININE 1.25*  GLUCOSE 188*  CALCIUM 9.6     EKG: Sinus bradycardia and ST depressions in the lateral leads  with ?biphasic P waves  DG Chest 2 View  Result Date: 06/02/2021 CLINICAL  DATA:  Chest pain EXAM: CHEST - 2 VIEW COMPARISON:  03/14/2021 FINDINGS: Post sternotomy changes. No focal opacity or significant pleural effusion. Cardiomediastinal silhouette within normal limits. No pneumothorax. IMPRESSION: No active cardiopulmonary disease. Electronically Signed   By: Jasmine Pang M.D.   On: 06/02/2021 16:02     Katha Cabal, DO 06/03/2021, 6:16 AM PGY-3, [facility] FPTS Intern pager: [phone], text pages welcome

## 2021-06-03 NOTE — ED Notes (Signed)
Breakfast Order placed ?

## 2021-06-03 NOTE — Consult Note (Signed)
Cardiology Consultation:   Patient ID: Andrea Mueller MRN: [id number]; DOB: [birth year]  Admit date: 06/02/2021 Date of Consult: 06/03/2021  PCP:  Jeanice Lim, PA-C   [facility] Providers Cardiologist:  None        Patient Profile:   Andrea Mueller is a 65 y.o. female with a hx of CAD s/p CABG and HFrEF who is being seen 06/03/2021 for the evaluation of chest pain at the request of Dr. Pilar Plate.  History of Present Illness:   Andrea Mueller is a 65 year old female with history of CABG in 11/2020 complicated by systolic heart failure, CKD3, Q7YP, anxiety/depression who presents for evaluation of chest pain.  She was admitted in August of 2022 following a presentation with cardiogenic shock at [facility] where she was found to have new severely depressed LVEF with an occluded SVG --> diagonal.  She required inotrope assisted diuresis and was transferred here for advanced therapies workup.  She was weaned off of inotropes and started on SGLT2, MRA, and ARB.    She reports weight has been stabling to decreasing since admission.  She is exercising at rehab, watching her diet, and weighing herself daily.  Over the weekend she had episodes of chest pressure radiating to her neck occurring at rest.  These abated after she took a nap.  She did not try nitroglycerin.  She went to rehab on Monday and had to back off from her normal level of exertion on the treadmill as she developed chest pressure.  Previously she had been progressing well with rehab.  ECG reviewed NSR with known RBBB some t-wave inversions V1/V2 different from prior; may relate to technique.  Troponin 15, 17 and GFR stable 48  Past Medical History:  Diagnosis Date   CAD (coronary artery disease), native coronary artery    CKD (chronic kidney disease) stage 3, GFR 30-59 ml/min (HCC)    Type 2 diabetes mellitus treated without insulin (HCC)     Past Surgical History:  Procedure Laterality Date   CORONARY ARTERY BYPASS GRAFT        Home Medications:  Prior to Admission medications   Medication Sig Start Date End Date Taking? Authorizing Provider  acetaminophen (TYLENOL) 325 MG tablet Take 325 mg by mouth every 6 (six) hours as needed for moderate pain or headache.   Yes [provider]  aspirin 81 MG EC tablet Take 1 tablet (81 mg total) by mouth daily. Swallow whole. 03/18/21  Yes Clegg, Amy D, NP  atorvastatin (LIPITOR) 80 MG tablet Take 1 tablet (80 mg total) by mouth daily. 03/18/21  Yes Clegg, Amy D, NP  busPIRone (BUSPAR) 5 MG tablet Take 1 tablet (5 mg total) by mouth 3 (three) times daily. 03/18/21  Yes Clegg, Amy D, NP  clopidogrel (PLAVIX) 75 MG tablet Take 75 mg by mouth daily. 12/23/20  Yes [provider]  dapagliflozin propanediol (FARXIGA) 10 MG TABS tablet Take 1 tablet (10 mg total) by mouth daily. 03/18/21  Yes Clegg, Amy D, NP  digoxin (LANOXIN) 0.125 MG tablet Take 0.5 tablets (0.0625 mg total) by mouth daily. 03/18/21  Yes Clegg, Amy D, NP  insulin NPH-regular Human (70-30) 100 UNIT/ML injection Inject 20 Units into the skin 2 (two) times daily with a meal.   Yes [provider]  LORazepam (ATIVAN) 0.5 MG tablet Take 0.25-0.5 mg by mouth daily as needed for anxiety. 01/14/21  Yes [provider]  losartan (COZAAR) 25 MG tablet Take 1 tablet (25 mg total) by  mouth at bedtime. 03/18/21  Yes Clegg, Amy D, NP  melatonin 5 MG TABS Take 5 mg by mouth at bedtime.   Yes [provider]  Omega-3 Fatty Acids (FISH OIL) 1000 MG CAPS Take 1,000 mg by mouth daily.   Yes [provider]  senna-docusate (SENOKOT-S) 8.6-50 MG tablet Take 1 tablet by mouth daily.   Yes [provider]  sertraline (ZOLOFT) 50 MG tablet Take 1 tablet (50 mg total) by mouth daily. 03/18/21  Yes Clegg, Amy D, NP  spironolactone (ALDACTONE) 25 MG tablet Take 1 tablet (25 mg total) by mouth daily. 03/18/21  Yes Clegg, Amy D, NP  torsemide (DEMADEX) 20 MG tablet Take 1 tablet (20 mg total) by mouth  daily. 03/18/21  Yes Clegg, Amy D, NP  cephALEXin (KEFLEX) 500 MG capsule Take 500 mg by mouth See admin instructions. Tid x 5 days Patient not taking: No sig reported 05/18/21   [provider]    Inpatient Medications: Scheduled Meds:  Continuous Infusions:  PRN Meds:   Allergies:    Allergies  Allergen Reactions   Statins     Other reaction(s): Other Myalgia   Codeine Nausea Only    Social History:   Social History   Socioeconomic History   Marital status: Widowed    Spouse name: Not on file   Number of children: Not on file   Years of education: Not on file   Highest education level: Not on file  Occupational History   Not on file  Tobacco Use   Smoking status: Former    Packs/day: 1.00    Years: 15.00    Pack years: 15.00    Types: Cigarettes    Start date: 08/16/1993    Quit date: 08/16/2000    Years since quitting: 20.8   Smokeless tobacco: Never  Substance and Sexual Activity   Alcohol use: Not Currently   Drug use: Not Currently   Sexual activity: Not Currently  Other Topics Concern   Not on file  Social History Narrative   Not on file   Social Determinants of Health   Financial Resource Strain: Medium Risk   Difficulty of Paying Living Expenses: Somewhat hard  Food Insecurity: Food Insecurity Present   Worried About Running Out of Food in the Last Year: Sometimes true   Ran Out of Food in the Last Year: Never true  Transportation Needs: No Transportation Needs   Lack of Transportation (Medical): No   Lack of Transportation (Non-Medical): No  Physical Activity: Not on file  Stress: Not on file  Social Connections: Not on file  Intimate Partner Violence: Not on file    Family History:   Reviewed and noncontributory.  ROS:  Please see the history of present illness.  All other ROS reviewed and negative.     Physical Exam/Data:   Vitals:   06/03/21 0053 06/03/21 0056 06/03/21 0100 06/03/21 0235  BP: (!) 137/55  127/60 (!) 124/51   Pulse: 62  93 62  Resp: 16  19 20   Temp: 97.9 F (36.6 C)     TempSrc: Oral     SpO2: 100% 100% 100% 97%  Weight: 74.8 kg     Height: 5\' 3"  (1.6 m)      No intake or output data in the 24 hours ending 06/03/21 0322 Last 3 Weights 06/03/2021 05/08/2021 04/22/2021  Weight (lbs) 165 lb 169 lb 6.4 oz 170 lb 12.8 oz  Weight (kg) 74.844 kg 76.839 kg 77.474 kg  Body mass index is 29.23 kg/m.  General:  Well nourished, well developed, in no acute distress HEENT: normal Neck: no JVD Vascular: No carotid bruits; Distal pulses 2+ bilaterally Cardiac:  normal S1, S2; RRR; no murmur Lungs:  clear to auscultation bilaterally, no wheezing, rhonchi or rales  Abd: soft, nontender, no hepatomegaly  Ext: no edema Musculoskeletal:  No deformities, BUE and BLE strength normal and equal Skin: warm and dry  Neuro:  CNs 2-12 intact, no focal abnormalities noted Psych:  Normal affect   EKG:  The EKG was personally reviewed and demonstrates:  NSR RBBB NSST changes Telemetry:  Telemetry was personally reviewed and demonstrates:  NSR  Relevant CV Studies: CTO mLAD D1 diffuse obstructive disease mLCx CTO mRCA CTO SVG --> Diagonal subtotal occlusion SVG --> OM 90% anastomotic stneosis SVG --> RCA patent [place]--> LAD patent to diffusely diseased distal LAD  Laboratory Data:  High Sensitivity Troponin:   Recent Labs  Lab 06/02/21 1446 06/02/21 1836  TROPONINIHS 15 17     Chemistry Recent Labs  Lab 06/02/21 1446  NA 137  K 4.9  CL 98  CO2 30  GLUCOSE 188*  BUN 31*  CREATININE 1.25*  CALCIUM 9.6  GFRNONAA 48*  ANIONGAP 9    No results for input(s): PROT, ALBUMIN, AST, ALT, ALKPHOS, BILITOT in the last 168 hours. Lipids No results for input(s): CHOL, TRIG, HDL, LABVLDL, LDLCALC, CHOLHDL in the last 168 hours.  Hematology Recent Labs  Lab 06/02/21 1446  WBC 8.8  RBC 4.74  HGB 13.1  HCT 40.8  MCV 86.1  MCH 27.6  MCHC 32.1  RDW 16.1*  PLT 152   Thyroid No results for  input(s): TSH, FREET4 in the last 168 hours.  BNP Recent Labs  Lab 06/02/21 1446  BNP 234.8*    DDimer No results for input(s): DDIMER in the last 168 hours.   Radiology/Studies:  DG Chest 2 View  Result Date: 06/02/2021 CLINICAL DATA:  Chest pain EXAM: CHEST - 2 VIEW COMPARISON:  03/14/2021 FINDINGS: Post sternotomy changes. No focal opacity or significant pleural effusion. Cardiomediastinal silhouette within normal limits. No pneumothorax. IMPRESSION: No active cardiopulmonary disease. Electronically Signed   By: Jasmine Pang M.D.   On: 06/02/2021 16:02     Assessment and Plan:   65 year old female with history of 4V CABG (see anatomy above) 4/22, ICM with HFrEF25-30%, T2DM, CKD presenting with chest pressure.  She does not have signs or findings of decompensated heart failure.  Given recent angiogram in July favor vasodilator MPI to further evaluate.  If there are areas of significant residual ischemia following revascularization then could consider repeat angiogram.  Otherwise, I discussed with the patient that given prior revascularization result with severe native disease distal to bypass grafts she will likely have some level of chronic angina that may unmask itself as she continues to increase her exertional capacity.  #Chest pain - Plan stress vasodilator MPI, keep NPO - Pending results, may require some antianginals  #Coronary artery disease s/p CABG 4/22 - Continue ASA 81, plavix 75 mg daily - Conitnue atorvastatin 80 mg daily  #Ischemic cardiomyopathy with chronic systolic and diastolic hear tfailure - Continue spironolactone 25 mg daily - Continue losartan 25 mg daily - Continue dapaglifloxin 10 mg daily - Hold off on BB  #T2DM - Dapagliflozin as above  #CKD3 - Dapagliflozin as above - Avoid nephrotoxins  Risk Assessment/Risk Scores:   New [place] Heart Association (NYHA) Functional Class NYHA Class II  For questions or updates, please contact [facility] Please consult [email or web address] for contact info under    Signed, Regino Schultze, MD  06/03/2021 3:22 AM

## 2021-06-03 NOTE — Progress Notes (Signed)
2D echocardiogram with Definity completed.  06/03/2021 11:07 AM Eula Fried., MHA, RVT, RDCS, RDMS

## 2021-06-04 ENCOUNTER — Telehealth (HOSPITAL_COMMUNITY): Payer: Self-pay | Admitting: Cardiology

## 2021-06-23 ENCOUNTER — Encounter (HOSPITAL_COMMUNITY): Payer: Self-pay

## 2021-06-23 ENCOUNTER — Ambulatory Visit (HOSPITAL_COMMUNITY)
Admission: RE | Admit: 2021-06-23 | Discharge: 2021-06-23 | Disposition: A | Discharge status: Home / Self Care | Payer: 59 | Source: Ambulatory Visit | Attending: Family Medicine | Admitting: Family Medicine

## 2021-06-23 ENCOUNTER — Other Ambulatory Visit: Payer: Self-pay

## 2021-06-23 VITALS — BP 90/58 | HR 62 | Wt 163.6 lb

## 2021-06-23 DIAGNOSIS — Z79899 Other long term (current) drug therapy: Secondary | ICD-10-CM | POA: Insufficient documentation

## 2021-06-23 DIAGNOSIS — I255 Ischemic cardiomyopathy: Secondary | ICD-10-CM | POA: Insufficient documentation

## 2021-06-23 DIAGNOSIS — D509 Iron deficiency anemia, unspecified: Secondary | ICD-10-CM | POA: Insufficient documentation

## 2021-06-23 DIAGNOSIS — R0602 Shortness of breath: Secondary | ICD-10-CM | POA: Diagnosis present

## 2021-06-23 DIAGNOSIS — N183 Chronic kidney disease, stage 3 unspecified: Secondary | ICD-10-CM | POA: Diagnosis not present

## 2021-06-23 DIAGNOSIS — F419 Anxiety disorder, unspecified: Secondary | ICD-10-CM | POA: Insufficient documentation

## 2021-06-23 DIAGNOSIS — Z7982 Long term (current) use of aspirin: Secondary | ICD-10-CM | POA: Diagnosis not present

## 2021-06-23 DIAGNOSIS — Z87891 Personal history of nicotine dependence: Secondary | ICD-10-CM | POA: Insufficient documentation

## 2021-06-23 DIAGNOSIS — I2510 Atherosclerotic heart disease of native coronary artery without angina pectoris: Secondary | ICD-10-CM | POA: Diagnosis not present

## 2021-06-23 DIAGNOSIS — I5042 Chronic combined systolic (congestive) and diastolic (congestive) heart failure: Secondary | ICD-10-CM | POA: Insufficient documentation

## 2021-06-23 DIAGNOSIS — Z7902 Long term (current) use of antithrombotics/antiplatelets: Secondary | ICD-10-CM | POA: Insufficient documentation

## 2021-06-23 DIAGNOSIS — E1122 Type 2 diabetes mellitus with diabetic chronic kidney disease: Secondary | ICD-10-CM | POA: Insufficient documentation

## 2021-06-23 DIAGNOSIS — Z09 Encounter for follow-up examination after completed treatment for conditions other than malignant neoplasm: Secondary | ICD-10-CM | POA: Insufficient documentation

## 2021-06-23 DIAGNOSIS — I340 Nonrheumatic mitral (valve) insufficiency: Secondary | ICD-10-CM | POA: Insufficient documentation

## 2021-06-23 DIAGNOSIS — E119 Type 2 diabetes mellitus without complications: Secondary | ICD-10-CM | POA: Diagnosis not present

## 2021-06-23 DIAGNOSIS — F32A Depression, unspecified: Secondary | ICD-10-CM | POA: Insufficient documentation

## 2021-06-23 LAB — CBC
HCT: 39.6 % (ref 36.0–46.0)
Hemoglobin: 12.5 g/dL (ref 12.0–15.0)
MCH: 27.7 pg (ref 26.0–34.0)
MCHC: 31.6 g/dL (ref 30.0–36.0)
MCV: 87.6 fL (ref 80.0–100.0)
Platelets: 161 10*3/uL (ref 150–400)
RBC: 4.52 MIL/uL (ref 3.87–5.11)
RDW: 15.1 % (ref 11.5–15.5)
WBC: 9.4 10*3/uL (ref 4.0–10.5)
nRBC: 0 % (ref 0.0–0.2)

## 2021-06-23 LAB — BASIC METABOLIC PANEL
Anion gap: 12 (ref 5–15)
BUN: 25 mg/dL — ABNORMAL HIGH (ref 8–23)
CO2: 29 mmol/L (ref 22–32)
Calcium: 9.8 mg/dL (ref 8.9–10.3)
Chloride: 97 mmol/L — ABNORMAL LOW (ref 98–111)
Creatinine, Ser: 1.23 mg/dL — ABNORMAL HIGH (ref 0.44–1.00)
GFR, Estimated: 49 mL/min — ABNORMAL LOW (ref >60)
Glucose, Bld: 127 mg/dL — ABNORMAL HIGH (ref 70–99)
Potassium: 4.1 mmol/L (ref 3.5–5.1)
Sodium: 138 mmol/L (ref 135–145)

## 2021-06-23 MED ORDER — LOSARTAN POTASSIUM 25 MG PO TABS: 12.5000 mg | ORAL_TABLET | Freq: Every day | ORAL | 2 refills | Status: DC

## 2021-06-23 NOTE — Patient Instructions (Signed)
EKG was done today  Labs were done today, if any labs are abnormal the clinic will call you  STOP Digoxin  DECREASE Losartan to 12.5 mg 1/2 tablet daily at bedtime  Your physician recommends that you schedule a follow-up appointment in: keep scheduled follow up with Dr. Shirlee Latch  At the [facility], you and your health needs are our priority. As part of our continuing mission to provide you with exceptional heart care, we have created designated Provider Care Teams. These Care Teams include your primary Cardiologist (physician) and Advanced Practice Providers (APPs- Physician Assistants and Nurse Practitioners) who all work together to provide you with the care you need, when you need it.   You may see any of the following providers on your designated Care Team at your next follow up: Dr Arvilla Meres Dr Carron Curie, NP Robbie Lis, [place] [facility] [place], [place] Karle Plumber, PharmD   Please be sure to bring in all your medications bottles to every appointment.   If you have any questions or concerns before your next appointment please send [place] a message through [place] or call our office at [phone].    TO LEAVE A MESSAGE FOR THE NURSE SELECT OPTION 2, PLEASE LEAVE A MESSAGE INCLUDING: YOUR NAME DATE OF BIRTH CALL BACK NUMBER REASON FOR CALL**this is important as we prioritize the call backs  YOU WILL RECEIVE A CALL BACK THE SAME DAY AS LONG AS YOU CALL BEFORE 4:00 PM

## 2021-06-23 NOTE — Progress Notes (Addendum)
Advanced Heart Failure Clinic Note    PCP: Jeanice Lim, PA-C PCP-Cardiologist: None  HF Cardiologist: Dr. Shirlee Latch  HPI: Andrea Mueller is a 65 y.o. female with hx of CAD s/p 4v CABG (11/2020), type 2 diabetes mellitus, anxiety/depression, CKD III, and recently diagnosed ICM.  She was diagnosed with coronary artery disease back in April 2022 after presenting to a hospital in [place] with fatigue and diaphoresis.  She underwent four-vessel CABG in [place] and appeared to initially recover quite well.  She returned to her home in [place] in May 2022, and was hospitalized 5/22 for decompensated heart failure and right lower leg cellulitis.  Her echo revealed normal LV systolic function.    In July 2022, she started to become more short of breath and felt pressure in her chest.  She presented to [facility] in [place] and was found to be in acute heart failure.  Her echo then (7/22) revealed new, significant LV systolic dysfunction with moderate mitral regurgitation.  She underwent a LHC which revealed severe native disease, and occluded vein graft to a diagonal, and otherwise patent bypass grafts.  She had elevated biventricular filling pressures with a normal cardiac index, although this was on a small amount of dobutamine.  She was diuresed and inotropes wean was attempted. She was reportedly hypotensive, and inotropes were restarted.  She was transferred to [facility], on dobutamine and milrinone, for further management and advanced therapies. Inotropes were gradually weaned off with stable Co-Ox's. CVP was followed closely and stable on po torsemide. Started on Farxiga, spironolactone, and losartan with stability. No bb with cardiogenic shock, but may consider outpatient. Renal function remained stable. VAD workup initiated. D/c weight 172 lbs.  Post hospital follow up she was doing well from HF standpoint, participating in CR 3x/week. GDMT limited by low BP.  Admitted 10/22 with chest  pain. Cardiac work up re-assuring and symptoms resolved. Echo showed EF up to 45-50%. Cardiology discussed inpatient stress testing vs outpatient, and together elected to follow up outpatient.  Today she returns for HF follow up with her daughter. Has good days and bad days with respect to her breathing. She does get SOB walking on flat ground. She is doing CR 3 days/week and tolerating this well, although has had low dBP in 30's at CR last week. She was asymptomatic.No further chest pains but has occasional tightness. Denies dizziness, edema, or PND/Orthopnea. Appetite ok. No fever or chills. Weight at home 163 pounds. Taking all medications.   ECG (personally reviewed): SB with RBBB, no acute ST-T changes.  Cardiac Studies: - CABG (4/22): SVG-D, SVG-OM, LIMA-LAD, SVG RCA - Coronary angiography (7/22): Totally occluded mid LAD, LCx, and RCA; 80-90% stenosis small D1; patent LIMA-small, diseased LAD, patent SVG-RCA with small PDA and PLV, patent SVG-OM but 90% stenosis in small native vessel at anastomosis, occluded SVG-D. - RHC (7/22): mean RA 12, PA 55/19 mean 33, mean PCWP 25, CI 3.12 (on inotrope) - Echo (7/22): EF 25-30%, moderate MR - CPX (9/22): Mild HF limitation, additional limitations related to body habitus, mild chronotropic incompetence (not on beta blocker)  Past Medical History:  Diagnosis Date   CAD (coronary artery disease), native coronary artery    CKD (chronic kidney disease) stage 3, GFR 30-59 ml/min (HCC)    Type 2 diabetes mellitus treated without insulin (HCC)    Current Outpatient Medications  Medication Sig Dispense Refill   acetaminophen (TYLENOL) 325 MG tablet Take 325 mg by mouth every 6 (six) hours as needed for moderate  pain or headache.     aspirin 81 MG EC tablet Take 1 tablet (81 mg total) by mouth daily. Swallow whole. 30 tablet 11   atorvastatin (LIPITOR) 80 MG tablet Take 1 tablet (80 mg total) by mouth daily. 30 tablet 6   busPIRone (BUSPAR) 5 MG tablet  Take 1 tablet (5 mg total) by mouth 3 (three) times daily. 90 tablet 6   clopidogrel (PLAVIX) 75 MG tablet Take 75 mg by mouth daily.     dapagliflozin propanediol (FARXIGA) 10 MG TABS tablet Take 1 tablet (10 mg total) by mouth daily. 30 tablet 6   digoxin (LANOXIN) 0.125 MG tablet Take 0.5 tablets (0.0625 mg total) by mouth daily. 15 tablet 6   insulin NPH-regular Human (70-30) 100 UNIT/ML injection Inject 20 Units into the skin 2 (two) times daily with a meal.     LORazepam (ATIVAN) 0.5 MG tablet Take 0.25-0.5 mg by mouth daily as needed for anxiety.     losartan (COZAAR) 25 MG tablet Take 1 tablet (25 mg total) by mouth at bedtime. 30 tablet 6   melatonin 5 MG TABS Take 5 mg by mouth at bedtime.     Omega-3 Fatty Acids (FISH OIL) 1000 MG CAPS Take 1,000 mg by mouth daily.     senna-docusate (SENOKOT-S) 8.6-50 MG tablet Take 1 tablet by mouth daily.     sertraline (ZOLOFT) 50 MG tablet Take 1 tablet (50 mg total) by mouth daily. 30 tablet 6   spironolactone (ALDACTONE) 25 MG tablet Take 1 tablet (25 mg total) by mouth daily. 30 tablet 6   torsemide (DEMADEX) 20 MG tablet Take 1 tablet (20 mg total) by mouth daily. 30 tablet 6   No current facility-administered medications for this encounter.   Allergies  Allergen Reactions   Statins     Other reaction(s): Other Myalgia   Codeine Nausea Only   Social History   Socioeconomic History   Marital status: Widowed    Spouse name: Not on file   Number of children: Not on file   Years of education: Not on file   Highest education level: Not on file  Occupational History   Not on file  Tobacco Use   Smoking status: Former    Packs/day: 1.00    Years: 15.00    Pack years: 15.00    Types: Cigarettes    Start date: 08/16/1993    Quit date: 08/16/2000    Years since quitting: 20.8   Smokeless tobacco: Never  Substance and Sexual Activity   Alcohol use: Not Currently   Drug use: Not Currently   Sexual activity: Not Currently  Other  Topics Concern   Not on file  Social History Narrative   Not on file   Social Determinants of Health   Financial Resource Strain: Medium Risk   Difficulty of Paying Living Expenses: Somewhat hard  Food Insecurity: Food Insecurity Present   Worried About Running Out of Food in the Last Year: Sometimes true   Ran Out of Food in the Last Year: Never true  Transportation Needs: No Transportation Needs   Lack of Transportation (Medical): No   Lack of Transportation (Non-Medical): No  Physical Activity: Not on file  Stress: Not on file  Social Connections: Not on file  Intimate Partner Violence: Not on file    No family history on file.  BP (!) 90/58   Pulse 62   Wt 74.2 kg   SpO2 100%   BMI 28.98 kg/m  Wt Readings from Last 3 Encounters:  06/23/21 74.2 kg  06/03/21 74.8 kg  05/08/21 76.8 kg   PHYSICAL EXAM: General:  NAD. No resp difficulty HEENT: Normal Neck: Supple. No JVD. Carotids 2+ bilat; no bruits. No lymphadenopathy or thryomegaly appreciated. Cor: PMI nondisplaced. Regular rate & rhythm. No rubs, gallops or murmurs. Lungs: Clear Abdomen: Soft, nontender, nondistended. No hepatosplenomegaly. No bruits or masses. Good bowel sounds. Extremities: No cyanosis, clubbing, rash, edema Neuro: Alert & oriented x 3, cranial nerves grossly intact. Moves all 4 extremities w/o difficulty. Affect pleasant.  ASSESSMENT & PLAN: 1. CAD: s/p CABG x 4 (4/22).  Cath in 7/22 with 3/4 grafts patent, occluded SVG-small D.  Native vessels, however, were small and diseased (diabetic coronaries). No further chest pain.  ECG with no acute changes. If recurs, would arrange for nuclear stress test. - Continue ASA 81 + Plavix. - Continue atorvastatin, lipids ok (7/22). 2. Systolic CHF: Ischemic cardiomyopathy.  Echo at [facility] with EF 25-30%, moderate MR.  She was admitted to [facility] with CHF, started on inotropes and diuresed.  RHC showed elevated filling pressures and preserved CO (but was on  inotropes at the time).  Unable to wean off inotropes successfully, sent to [facility] for consideration of LVAD. Able to wean off dobutamine and milrinone successfully.  CPX showed mild HF limitation. Echo 10/22 during recent admit showed EF up to 45-50%. Stable NYHA II- early III, suspect confounded due to physically deconditioned. She is not volume overloaded today, weight stable.  - No BP room for beta blocker. - Decrease losartan to 12.5 mg q hs with low BP in clinic and at CR. - Stop digoxin with improved EF. - Continue Farxiga 10 mg daily. No GU symptoms. - Continue spironolactone 25 daily.  BMET today. - Continue torsemide 20 mg daily. - No longer VAD or OHT candidate with improved EF. - Continue CR.  3. Type 2 DM: HgbA1c 7.6.   - Continue Farxiga. 4. Fe deficiency anemia: She had Feraheme during past admission. May be contributory to some of her symptoms.  - Check CBC today.  Follow up with Dr. Shirlee Latch as scheduled next month.  Anderson [place], FNP 06/23/21

## 2021-06-23 NOTE — Addendum Note (Signed)
Encounter addended by: Jacklynn Ganong, FNP on: 06/23/2021 3:56 PM  Actions taken: Clinical Note Signed

## 2021-06-26 ENCOUNTER — Telehealth (HOSPITAL_COMMUNITY): Payer: Self-pay | Admitting: *Deleted

## 2021-06-26 NOTE — Telephone Encounter (Addendum)
Natalie with [facility] called to report pt needs to have two toenails removed and would need to hold plavix. She asked if it is ok to hold plavix and if so for how long?  Routed to Dr.McLean for advice  Call back # [phone]

## 2021-06-26 NOTE — Telephone Encounter (Signed)
She should be ok to hold Plavix x 5 days, restart afterwards.

## 2021-06-26 NOTE — Telephone Encounter (Signed)
Called [name] she is aware and faxed advice to [phone]

## 2021-08-07 NOTE — Telephone Encounter (Signed)
Encounter completed.

## 2021-08-13 ENCOUNTER — Other Ambulatory Visit: Payer: Self-pay

## 2021-08-13 ENCOUNTER — Encounter (HOSPITAL_COMMUNITY): Payer: Self-pay | Admitting: Cardiology

## 2021-08-13 ENCOUNTER — Ambulatory Visit (HOSPITAL_BASED_OUTPATIENT_CLINIC_OR_DEPARTMENT_OTHER)
Admission: RE | Admit: 2021-08-13 | Discharge: 2021-08-13 | Disposition: A | Discharge status: Home / Self Care | Payer: 59 | Source: Ambulatory Visit | Attending: Cardiology | Admitting: Cardiology

## 2021-08-13 ENCOUNTER — Ambulatory Visit (HOSPITAL_COMMUNITY)
Admission: RE | Admit: 2021-08-13 | Discharge: 2021-08-13 | Disposition: A | Discharge status: Home / Self Care | Payer: 59 | Source: Ambulatory Visit | Attending: Cardiology | Admitting: Cardiology

## 2021-08-13 VITALS — BP 106/60 | HR 61 | Resp 98 | Wt 168.6 lb

## 2021-08-13 DIAGNOSIS — E1159 Type 2 diabetes mellitus with other circulatory complications: Secondary | ICD-10-CM | POA: Insufficient documentation

## 2021-08-13 DIAGNOSIS — N183 Chronic kidney disease, stage 3 unspecified: Secondary | ICD-10-CM | POA: Diagnosis not present

## 2021-08-13 DIAGNOSIS — Z79899 Other long term (current) drug therapy: Secondary | ICD-10-CM | POA: Diagnosis not present

## 2021-08-13 DIAGNOSIS — I951 Orthostatic hypotension: Secondary | ICD-10-CM | POA: Diagnosis not present

## 2021-08-13 DIAGNOSIS — F32A Depression, unspecified: Secondary | ICD-10-CM | POA: Insufficient documentation

## 2021-08-13 DIAGNOSIS — I2510 Atherosclerotic heart disease of native coronary artery without angina pectoris: Secondary | ICD-10-CM | POA: Insufficient documentation

## 2021-08-13 DIAGNOSIS — Z5941 Food insecurity: Secondary | ICD-10-CM | POA: Diagnosis not present

## 2021-08-13 DIAGNOSIS — F419 Anxiety disorder, unspecified: Secondary | ICD-10-CM | POA: Insufficient documentation

## 2021-08-13 DIAGNOSIS — I25810 Atherosclerosis of coronary artery bypass graft(s) without angina pectoris: Secondary | ICD-10-CM | POA: Diagnosis not present

## 2021-08-13 DIAGNOSIS — E1122 Type 2 diabetes mellitus with diabetic chronic kidney disease: Secondary | ICD-10-CM | POA: Diagnosis not present

## 2021-08-13 DIAGNOSIS — Z7984 Long term (current) use of oral hypoglycemic drugs: Secondary | ICD-10-CM | POA: Diagnosis not present

## 2021-08-13 DIAGNOSIS — I5042 Chronic combined systolic (congestive) and diastolic (congestive) heart failure: Secondary | ICD-10-CM

## 2021-08-13 DIAGNOSIS — Z7902 Long term (current) use of antithrombotics/antiplatelets: Secondary | ICD-10-CM | POA: Insufficient documentation

## 2021-08-13 DIAGNOSIS — I340 Nonrheumatic mitral (valve) insufficiency: Secondary | ICD-10-CM | POA: Insufficient documentation

## 2021-08-13 DIAGNOSIS — Z951 Presence of aortocoronary bypass graft: Secondary | ICD-10-CM | POA: Diagnosis not present

## 2021-08-13 DIAGNOSIS — I5022 Chronic systolic (congestive) heart failure: Secondary | ICD-10-CM | POA: Diagnosis not present

## 2021-08-13 DIAGNOSIS — Z596 Low income: Secondary | ICD-10-CM | POA: Diagnosis not present

## 2021-08-13 DIAGNOSIS — Z7982 Long term (current) use of aspirin: Secondary | ICD-10-CM | POA: Diagnosis not present

## 2021-08-13 LAB — ECHOCARDIOGRAM COMPLETE
AR max vel: 2.13 cm2
AV Peak grad: 6.2 mmHg
Ao pk vel: 1.24 m/s
Area-P 1/2: 3.51 cm2
Calc EF: 38.1 %
S' Lateral: 3.8 cm
Single Plane A2C EF: 38.8 %
Single Plane A4C EF: 37.1 %

## 2021-08-13 LAB — BASIC METABOLIC PANEL
Anion gap: 9 (ref 5–15)
BUN: 36 mg/dL — ABNORMAL HIGH (ref 8–23)
CO2: 29 mmol/L (ref 22–32)
Calcium: 9.3 mg/dL (ref 8.9–10.3)
Chloride: 100 mmol/L (ref 98–111)
Creatinine, Ser: 1.15 mg/dL — ABNORMAL HIGH (ref 0.44–1.00)
GFR, Estimated: 53 mL/min — ABNORMAL LOW (ref >60)
Glucose, Bld: 104 mg/dL — ABNORMAL HIGH (ref 70–99)
Potassium: 4.5 mmol/L (ref 3.5–5.1)
Sodium: 138 mmol/L (ref 135–145)

## 2021-08-13 MED ORDER — PERFLUTREN LIPID MICROSPHERE
1.0000 mL | INTRAVENOUS | Status: DC | PRN
  Administered 2021-08-13: 11:00:00 2 mL via INTRAVENOUS
  Filled 2021-08-13: qty 10

## 2021-08-13 MED ORDER — METOPROLOL SUCCINATE ER 25 MG PO TB24: 12.5000 mg | ORAL_TABLET | Freq: Every day | ORAL | 4 refills | Status: DC

## 2021-08-13 NOTE — Patient Instructions (Signed)
Medication Changes:  Start Metoprolol 12.5mg  (1/2 Tab) at bedtime  Lab Work:  Labs done today, your results will be available in MyChart, we will contact you for abnormal readings.   Testing/Procedures:  none  Referrals:  none  Special Instructions // Education:  none  Follow-Up in: 4 months (April 2023 ) ** Call in March for appointment**  At the Advanced Heart Failure Clinic, you and your health needs are our priority. We have a designated team specialized in the treatment of Heart Failure. This Care Team includes your primary Heart Failure Specialized Cardiologist (physician), Advanced Practice Providers (APPs- Physician Assistants and Nurse Practitioners), and Pharmacist who all work together to provide you with the care you need, when you need it.   You may see any of the following providers on your designated Care Team at your next follow up:  Dr Arvilla Meres Dr Carron Curie, NP Robbie Lis, [place] [facility] [place], [place] Karle Plumber, PharmD   Please be sure to bring in all your medications bottles to every appointment.   Need to Contact us:  If you have any questions or concerns before your next appointment please send [place] a message through [place] or call our office at [phone].    TO LEAVE A MESSAGE FOR THE NURSE SELECT OPTION 2, PLEASE LEAVE A MESSAGE INCLUDING: YOUR NAME DATE OF BIRTH CALL BACK NUMBER REASON FOR CALL**this is important as we prioritize the call backs  YOU WILL RECEIVE A CALL BACK THE SAME DAY AS LONG AS YOU CALL BEFORE 4:00 PM

## 2021-08-13 NOTE — Progress Notes (Signed)
Advanced Heart Failure Clinic Note    PCP: Jeanice Lim, PA-C HF Cardiologist: Dr. Shirlee Latch  HPI: Andrea Mueller is a 65 y.o. female with hx of CAD s/p 4v CABG (11/2020), type 2 diabetes mellitus, anxiety/depression, CKD III, and ischemic cardiomyopathy.  She was diagnosed with coronary artery disease back in April 2022 after presenting to a hospital in [place] with fatigue and diaphoresis.  She underwent four-vessel CABG in [place] and appeared to initially recover quite well.  She returned to her home in [place] in May 2022, and was hospitalized 5/22 for decompensated heart failure and right lower leg cellulitis.  Her echo revealed normal LV systolic function.    In July 2022, she started to become more short of breath and felt pressure in her chest.  She presented to [facility] in [place] and was found to be in acute heart failure.  Her echo then (7/22) revealed new, significant LV systolic dysfunction with moderate mitral regurgitation.  She underwent a LHC which revealed severe native disease, and occluded vein graft to a diagonal, and otherwise patent bypass grafts.  She had elevated biventricular filling pressures with a normal cardiac index, although this was on a small amount of dobutamine.  She was diuresed and inotropes wean was attempted. She was reportedly hypotensive, and inotropes were restarted.  She was transferred to [facility], on dobutamine and milrinone, for further management and advanced therapies. Inotropes were gradually weaned off with stable Co-ox's. CVP was followed closely and stable on po torsemide. Started on Farxiga, spironolactone, and losartan with stability. Renal function remained stable. VAD workup initiated. D/c weight 172 lbs.  Post hospital follow up she was doing well from HF standpoint, participating in CR 3x/week. GDMT limited by low BP.  Admitted 10/22 with chest pain. Cardiac work up re-assuring and symptoms resolved. Echo showed EF up to 45-50%.  Cardiology discussed inpatient stress testing vs outpatient, and together elected to follow up outpatient.  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   Today she returns for HF follow up.  She has been doing well.  She has finished cardiac rehab and plans to start maintenance rehab.  No chest pain.  Dyspnea walking up stairs.  Recent cellulitis of left leg is improving on cephalexin. No problems shopping, running her vacuum.   Labs (10/22): LDL 26 Labs (12/22): K 4.4, creatinine 1.33  PMH: 1. CAD: CABG (4/22) with SVG-D, SVG-OM, LIMA-LAD, SVG RCA.  - Coronary angiography (7/22): Totally occluded mid LAD, LCx, and RCA; 80-90% stenosis small D1; patent LIMA-small, diseased LAD, patent SVG-RCA with small PDA and PLV, patent SVG-OM but 90% stenosis in small native vessel at anastomosis, occluded SVG-D.  Medical management.  2. Chronic systolic CHF: Ischemic cardiomyopathy.  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 3. CKD stage 3 4. Type 2 diabetes.  5. Anxiety  Current Outpatient Medications  Medication Sig Dispense Refill   acetaminophen (TYLENOL) 325 MG tablet Take 325 mg by mouth every 6 (six) hours as needed for moderate pain or headache.     aspirin 81 MG EC tablet Take 1 tablet (81 mg total) by  mouth daily. Swallow whole. 30 tablet 11   atorvastatin (LIPITOR) 80 MG tablet Take 1 tablet (80 mg total) by mouth daily. 30 tablet 6   busPIRone (BUSPAR) 5 MG tablet Take 1 tablet (5 mg total) by mouth 3 (three) times daily. 90 tablet 6   cephALEXin (KEFLEX) 500 MG capsule Take 500 mg  by mouth 3 (three) times daily.     clopidogrel (PLAVIX) 75 MG tablet Take 75 mg by mouth daily.     dapagliflozin propanediol (FARXIGA) 10 MG TABS tablet Take 1 tablet (10 mg total) by mouth daily. 30 tablet 6   insulin NPH-regular Human (70-30) 100 UNIT/ML injection Inject 20 Units into the skin 2 (two) times daily with a meal.     LORazepam (ATIVAN) 0.5 MG tablet Take 0.25-0.5 mg by mouth daily as needed for anxiety.     losartan (COZAAR) 25 MG tablet Take 0.5 tablets (12.5 mg total) by mouth at bedtime. 30 tablet 2   melatonin 5 MG TABS Take 5 mg by mouth at bedtime.     metoprolol succinate (TOPROL XL) 25 MG 24 hr tablet Take 0.5 tablets (12.5 mg total) by mouth at bedtime. 15 tablet 4   Omega-3 Fatty Acids (FISH OIL) 1000 MG CAPS Take 1,000 mg by mouth daily.     senna-docusate (SENOKOT-S) 8.6-50 MG tablet Take 1 tablet by mouth daily.     sertraline (ZOLOFT) 50 MG tablet Take 1 tablet (50 mg total) by mouth daily. 30 tablet 6   spironolactone (ALDACTONE) 25 MG tablet Take 1 tablet (25 mg total) by mouth daily. 30 tablet 6   torsemide (DEMADEX) 20 MG tablet Take 1 tablet (20 mg total) by mouth daily. 30 tablet 6   No current facility-administered medications for this encounter.   Allergies  Allergen Reactions   Statins     Other reaction(s): Other Myalgia   Codeine Nausea Only   Social History   Socioeconomic History   Marital status: Widowed    Spouse name: Not on file   Number of children: Not on file   Years of education: Not on file   Highest education level: Not on file  Occupational History   Not on file  Tobacco Use   Smoking status: Former    Packs/day: 1.00    Years: 15.00    Pack years: 15.00    Types: Cigarettes    Start date: 08/16/1993    Quit date: 08/16/2000    Years since quitting: 21.0   Smokeless tobacco: Never  Substance and Sexual Activity   Alcohol use: Not Currently   Drug use: Not Currently   Sexual activity: Not Currently  Other Topics Concern    Not on file  Social History Narrative   Not on file   Social Determinants of Health   Financial Resource Strain: Medium Risk   Difficulty of Paying Living Expenses: Somewhat hard  Food Insecurity: Food Insecurity Present   Worried About Running Out of Food in the Last Year: Sometimes true   Ran Out of Food in the Last Year: Never true  Transportation Needs: No Transportation Needs   Lack of Transportation (Medical): No   Lack of Transportation (Non-Medical): No  Physical Activity: Not on file  Stress: Not on file  Social Connections: Not on file  Intimate Partner Violence: Not on file   Family history: No premature CAD.   ROS: All systems reviewed and negative except as per HPI.   BP 106/60    Pulse 61  Resp (!) 98    Wt 76.5 kg (168 lb 9.6 oz)    BMI 29.87 kg/m   Wt Readings from Last 3 Encounters:  08/13/21 76.5 kg (168 lb 9.6 oz)  06/23/21 74.2 kg (163 lb 9.6 oz)  06/03/21 74.8 kg (165 lb)   PHYSICAL EXAM: General: NAD Neck: No JVD, no thyromegaly or thyroid nodule.  Lungs: Clear to auscultation bilaterally with normal respiratory effort. CV: Nondisplaced PMI.  Heart regular S1/S2, no S3/S4, no murmur.  No peripheral edema.  No carotid bruit.  Normal pedal pulses.  Abdomen: Soft, nontender, no hepatosplenomegaly, no distention.  Skin: Improving cellulitis on left lower leg.   Neurologic: Alert and oriented x 3.  Psych: Normal affect. Extremities: No clubbing or cyanosis.  HEENT: Normal.   ASSESSMENT & PLAN: 1. CAD: s/p CABG x 4 (4/22).  Cath in 7/22 with 3/4 grafts patent, occluded SVG-small D.  Native vessels, however, were small and diseased (diabetic coronaries). No chest pain.  - Continue ASA 81 + Plavix. - Continue atorvastatin, lipids ok (10/22). 2. Systolic CHF: Ischemic cardiomyopathy.  Echo at [facility] in 7/22 with EF 25-30%, moderate MR.  She was admitted to [facility] in 7/22 with CHF, started on inotropes and diuresed.  RHC showed elevated filling  pressures and preserved CO (but was on inotropes at the time).  Unable to wean off inotropes successfully, sent to [facility] for consideration of LVAD. Able to wean off dobutamine and milrinone successfully.  CPX showed mild HF limitation. Echo 10/22 showed EF up to 45-50%. Echo today shows EF 50-55% with small apical aneurysm and no thrombus.  Stable NYHA II symptoms, not volume overloaded.  GDMT has been limited by hypotension/orthostasis.  - Can add low dose Toprol XL 12.5 mg daily.  - Continue losartan 12.5 qhs. - Continue Farxiga 10 mg daily. No GU symptoms. - Continue spironolactone 25 daily.  BMET today. - Continue torsemide 20 mg daily. - No longer VAD or OHT candidate with improved EF. - Would like her to do maintenance cardiac rehab.  3. CKD stage 3: BMET today.   Followup in 4 months.   Marca Ancona, MD 08/13/21

## 2021-09-03 ENCOUNTER — Telehealth (HOSPITAL_COMMUNITY): Payer: Self-pay | Admitting: Pharmacy Technician

## 2021-09-03 ENCOUNTER — Other Ambulatory Visit (HOSPITAL_COMMUNITY): Payer: Self-pay

## 2021-09-03 MED ORDER — LOSARTAN POTASSIUM 25 MG PO TABS: 12.5000 mg | ORAL_TABLET | Freq: Every day | ORAL | 3 refills | Status: DC

## 2021-09-03 MED ORDER — METOPROLOL SUCCINATE ER 25 MG PO TB24: 12.5000 mg | ORAL_TABLET | Freq: Every day | ORAL | 3 refills | Status: DC

## 2021-09-03 MED ORDER — TORSEMIDE 20 MG PO TABS: 20.0000 mg | ORAL_TABLET | Freq: Every day | ORAL | 3 refills | Status: DC

## 2021-09-03 MED ORDER — SPIRONOLACTONE 25 MG PO TABS: 25.0000 mg | ORAL_TABLET | Freq: Every day | ORAL | 3 refills | Status: DC

## 2021-09-03 MED ORDER — CLOPIDOGREL BISULFATE 75 MG PO TABS: 75.0000 mg | ORAL_TABLET | Freq: Every day | ORAL | 3 refills | Status: DC

## 2021-09-03 MED ORDER — DAPAGLIFLOZIN PROPANEDIOL 10 MG PO TABS: 10.0000 mg | ORAL_TABLET | Freq: Every day | ORAL | 3 refills | Status: AC

## 2021-09-03 NOTE — Telephone Encounter (Signed)
Received notification from [facility] regarding a prior authorization for  [facility] . Authorization has been APPROVED from 08/20/21 to 09/03/22.   Approved 30 for 30 days.  Approval scanned into chart.  Authorization # [id number] Phone # [phone]

## 2021-09-03 NOTE — Telephone Encounter (Signed)
Patient Advocate Encounter   Received notification from [facility] that prior authorization for [facility] is required.   PA submitted on CoverMyMeds Key [id number] Status is pending   Will continue to follow.

## 2021-09-03 NOTE — Telephone Encounter (Signed)
Advanced Heart Failure Patient Advocate Encounter  90 day co-pay, $90. Sent 90 day RX request to [employer]) to send to [employer].   Archer Asa, CPhT

## 2021-09-04 ENCOUNTER — Other Ambulatory Visit (HOSPITAL_COMMUNITY): Payer: Self-pay

## 2021-10-13 ENCOUNTER — Other Ambulatory Visit (HOSPITAL_COMMUNITY): Payer: Self-pay | Admitting: Adult Health

## 2021-11-04 ENCOUNTER — Telehealth (HOSPITAL_COMMUNITY): Payer: Self-pay | Admitting: *Deleted

## 2021-11-04 NOTE — Telephone Encounter (Signed)
Received mess from pt through mychart appt request: ? ?Comments: ?Shortness of breath and swelling. Heart Failure patient was supposed to have a check up in April but when I tried to schedule they had no appt. Until end of June? I DO NOT FEEL COMFORTABLE WAITING THAT LONG TO HAVE BLOOD WORK AND HEART CHECKED ? ? ?Called pt to f/u. She states she has gained about 8 lbs over past couple of months and has been having issues with LE edema and increased SOB over past few days. She states edema is in feet/ankle, is better in AM but swells during the day. She is SOB with minimal exertion. We discussed salt/fluid intake, she states she hasn't had anything out of the ordinary lately, we also discussed elevating legs during the day, which she states she does, will try to do more often. She has been taking all meds as prescribed including torsemide 20 mg daily, spiro, and farxiga. Advised ok to take extra dose of Torsemide today or tomorrow to see if that helps, f/u appt sch for 4/3 ?

## 2021-11-09 ENCOUNTER — Other Ambulatory Visit (HOSPITAL_COMMUNITY): Payer: Self-pay | Admitting: Adult Health

## 2021-11-13 NOTE — Progress Notes (Signed)
?Advanced Heart Failure Clinic Note  ? ? ?PCP: Aline Brochure, PA-C ?HF Cardiologist: Dr. Aundra Dubin ? ?HPI: ?Andrea Mueller is a 66 y.o. female with hx of CAD s/p 4v CABG (11/2020), type 2 diabetes mellitus, anxiety/depression, CKD III, and ischemic cardiomyopathy. ? ?She was diagnosed with coronary artery disease back in April 2022 after presenting to a hospital in [place] with fatigue and diaphoresis.  She underwent four-vessel CABG in [place] and appeared to initially recover quite well.  She returned to her home in [place] in May 2022, and was hospitalized 5/22 for decompensated heart failure and right lower leg cellulitis.  Her echo revealed normal LV systolic function.   ? ?In July 2022, she started to become more short of breath and felt pressure in her chest.  She presented to [facility] in [place] and was found to be in acute heart failure.  Her echo then (7/22) revealed new, significant LV systolic dysfunction with moderate mitral regurgitation.  She underwent a LHC which revealed severe native disease, and occluded vein graft to a diagonal, and otherwise patent bypass grafts.  She had elevated biventricular filling pressures with a normal cardiac index, although this was on a small amount of dobutamine.  She was diuresed and inotropes wean was attempted. She was reportedly hypotensive, and inotropes were restarted.  She was transferred to [facility], on dobutamine and milrinone, for further management and advanced therapies. Inotropes were gradually weaned off with stable Co-ox's. CVP was followed closely and stable on po torsemide. Started on Farxiga, spironolactone, and losartan with stability. Renal function remained stable. VAD workup initiated. D/c weight 172 lbs. ? ?Post hospital follow up she was doing well from HF standpoint, participating in CR 3x/week. GDMT limited by low BP. ? ?Admitted 10/22 with chest pain. Cardiac work up re-assuring and symptoms resolved. Echo showed EF up to 45-50%.  Cardiology discussed inpatient stress testing vs outpatient, and together elected to follow up outpatient. ? 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  ? ?Today she returns for an acute HF visit. She is more SOB with minimal activity and has worsening LE edema. No clear trigger. She increased her torsemide to 40 daily with little improvement in symptoms. Denies abnormal bleeding, CP, dizziness, or PND/Orthopnea. Appetite ok. No fever or chills. Weight at home 192-194 pounds. Taking all medications.  ? ?ReDs: 34% ? ?ECG (personally reviewed): SB rBBB QRS 140 msec ? ?Labs (10/22): LDL 26 ?Labs (12/22): K 4.4, creatinine 1.33 ? ?PMH: ?1. CAD: CABG (4/22) with SVG-D, SVG-OM, LIMA-LAD, SVG RCA.  ?- Coronary angiography (7/22): Totally occluded mid LAD, LCx, and RCA; 80-90% stenosis small D1; patent LIMA-small, diseased LAD, patent SVG-RCA with small PDA and PLV, patent SVG-OM but 90% stenosis in small native vessel at anastomosis, occluded SVG-D.  Medical management.  ?2. Chronic systolic CHF: Ischemic cardiomyopathy.  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 ?3. CKD stage 3 ?4. Type 2 diabetes.  ?5. Anxiety ? ?Current Outpatient Medications  ?Medication Sig Dispense Refill  ? acetaminophen (TYLENOL) 325 MG tablet Take 325 mg by mouth every 6 (six) hours as needed for moderate pain or headache.    ?  aspirin 81 MG EC tablet Take 1 tablet (81 mg total) by mouth daily. Swallow whole. 30 tablet 11  ? atorvastatin (LIPITOR) 80 MG tablet Take 1 tablet (80 mg total) by mouth daily. 30 tablet 6  ? busPIRone (BUSPAR) 5 MG tablet Take 1  tablet (5 mg total) by mouth 3 (three) times daily. 90 tablet 6  ? clopidogrel (PLAVIX) 75 MG tablet Take 1 tablet (75 mg total) by mouth daily. 90 tablet 3  ? dapagliflozin propanediol (FARXIGA) 10 MG TABS tablet Take 1 tablet (10 mg total) by mouth daily. 90 tablet 3  ? insulin NPH-regular Human (70-30) 100 UNIT/ML injection Inject 20 Units into the skin 2 (two) times daily with a meal. Patient takes 20 units in the morning and  only take 12 units at night.    ? LORazepam (ATIVAN) 0.5 MG tablet Take 0.25-0.5 mg by mouth daily as needed for anxiety.    ? losartan (COZAAR) 25 MG tablet Take 0.5 tablets (12.5 mg total) by mouth at bedtime. 45 tablet 3  ? melatonin 5 MG TABS Take 5 mg by mouth at bedtime.    ? metoprolol succinate (TOPROL XL) 25 MG 24 hr tablet Take 0.5 tablets (12.5 mg total) by mouth at bedtime. 45 tablet 3  ? Omega-3 Fatty Acids (FISH OIL) 1000 MG CAPS Take 1,000 mg by mouth daily.    ? senna-docusate (SENOKOT-S) 8.6-50 MG tablet Take 1 tablet by mouth daily.    ? sertraline (ZOLOFT) 50 MG tablet Take 1 tablet by mouth once daily 30 tablet 0  ? spironolactone (ALDACTONE) 25 MG tablet Take 1 tablet (25 mg total) by mouth daily. 90 tablet 3  ? torsemide (DEMADEX) 20 MG tablet Take 1 tablet (20 mg total) by mouth daily. 90 tablet 3  ? ?No current facility-administered medications for this encounter.  ? ?Allergies  ?Allergen Reactions  ? Statins   ?  Other reaction(s): Other ?Myalgia  ? Codeine Nausea Only  ? ?Social History  ? ?Socioeconomic History  ? Marital status: Widowed  ?  Spouse name: Not on file  ? Number of children: Not on file  ? Years of education: Not on file  ? Highest education level: Not on file  ?Occupational History  ? Not on file  ?Tobacco Use  ? Smoking status: Former  ?  Packs/day: 1.00  ?  Years: 15.00  ?  Pack years: 15.00  ?  Types: Cigarettes  ?  Start date: 08/16/1993  ?  Quit date: 08/16/2000  ?  Years since quitting: 21.2  ? Smokeless tobacco: Never  ?Substance and Sexual  Activity  ? Alcohol use: Not Currently  ? Drug use: Not Currently  ? Sexual activity: Not Currently  ?Other Topics Concern  ? Not on file  ?Social History Narrative  ? Not on file  ? ?Social Determinants of Health  ? ?Financial Resource Strain: Medium Risk  ? Difficulty of Paying Living Expenses: Somewhat hard  ?Food Insecurity: Food Insecurity Present  ? Worried About Charity fundraiser in the Last Year: Sometimes true  ? Ran Out of Food in the Last Year: Never true  ?Transportation Needs: No Transportation Needs  ? Lack of Transportation (Medical): No  ? Lack of Transportation (Non-Medical): No  ?Physical Activity: Not on file  ?Stress: Not on file  ?Social Connections: Not on file  ?Intimate Partner Violence: Not on file  ? ?Family history: No premature CAD.  ? ?ROS: All systems reviewed and negative except as  per HPI.  ? ?BP (!) 102/54   Pulse (!) 52   Wt 86.5 kg (190 lb 9.6 oz)   SpO2 96%   BMI 33.76 kg/m?  ? ?Wt Readings from Last 3 Encounters:  ?11/16/21 86.5 kg (190 lb 9.6 oz)  ?08/13/21 76.5 kg (168 lb 9.6 oz)  ?06/23/21 74.2 kg (163 lb 9.6 oz)  ? ?PHYSICAL EXAM: ?General:  NAD. No resp difficulty ?HEENT: Normal ?Neck: Supple. JVP to ear. Carotids 2+ bilat; no bruits. No lymphadenopathy or thryomegaly appreciated. ?Cor: PMI nondisplaced. Regular rate & rhythm. No rubs, gallops or murmurs. ?Lungs: Clear ?Abdomen: Soft, nontender, nondistended. No hepatosplenomegaly. No bruits or masses. Good bowel sounds. ?Extremities: No cyanosis, clubbing, rash, 2+ BLE edema to knees ?Neuro: Alert & oriented x 3, cranial nerves grossly intact. Moves all 4 extremities w/o difficulty. Affect pleasant. ? ?ASSESSMENT & PLAN: ?1. Acute on chronic Systolic CHF: Ischemic cardiomyopathy.  Echo at [facility] in 7/22 with EF 25-30%, moderate MR.  She was admitted to [facility] in 7/22 with CHF, started on inotropes and diuresed.  RHC showed elevated filling pressures and preserved CO (but was on inotropes at the time).  Unable to  wean off inotropes successfully, sent to [facility] for consideration of LVAD. Able to wean off dobutamine and milrinone successfully.  CPX showed mild HF limitation. Echo 10/22 showed EF up to 45-50%. Echo 12/22 EF 50-

## 2021-11-16 ENCOUNTER — Encounter (HOSPITAL_COMMUNITY): Payer: Self-pay

## 2021-11-16 ENCOUNTER — Ambulatory Visit (HOSPITAL_COMMUNITY)
Admission: RE | Admit: 2021-11-16 | Discharge: 2021-11-16 | Disposition: A | Discharge status: Home / Self Care | Payer: 59 | Source: Ambulatory Visit | Attending: Internal Medicine | Admitting: Internal Medicine

## 2021-11-16 VITALS — BP 102/54 | HR 52 | Wt 190.6 lb

## 2021-11-16 DIAGNOSIS — N1831 Chronic kidney disease, stage 3a: Secondary | ICD-10-CM | POA: Diagnosis not present

## 2021-11-16 DIAGNOSIS — E1122 Type 2 diabetes mellitus with diabetic chronic kidney disease: Secondary | ICD-10-CM | POA: Insufficient documentation

## 2021-11-16 DIAGNOSIS — Z7984 Long term (current) use of oral hypoglycemic drugs: Secondary | ICD-10-CM | POA: Diagnosis not present

## 2021-11-16 DIAGNOSIS — I2510 Atherosclerotic heart disease of native coronary artery without angina pectoris: Secondary | ICD-10-CM | POA: Insufficient documentation

## 2021-11-16 DIAGNOSIS — Z7902 Long term (current) use of antithrombotics/antiplatelets: Secondary | ICD-10-CM | POA: Insufficient documentation

## 2021-11-16 DIAGNOSIS — I25810 Atherosclerosis of coronary artery bypass graft(s) without angina pectoris: Secondary | ICD-10-CM | POA: Insufficient documentation

## 2021-11-16 DIAGNOSIS — I340 Nonrheumatic mitral (valve) insufficiency: Secondary | ICD-10-CM | POA: Insufficient documentation

## 2021-11-16 DIAGNOSIS — I5033 Acute on chronic diastolic (congestive) heart failure: Secondary | ICD-10-CM | POA: Diagnosis not present

## 2021-11-16 DIAGNOSIS — Z79899 Other long term (current) drug therapy: Secondary | ICD-10-CM | POA: Insufficient documentation

## 2021-11-16 DIAGNOSIS — I255 Ischemic cardiomyopathy: Secondary | ICD-10-CM | POA: Insufficient documentation

## 2021-11-16 DIAGNOSIS — Z7982 Long term (current) use of aspirin: Secondary | ICD-10-CM | POA: Insufficient documentation

## 2021-11-16 DIAGNOSIS — I5042 Chronic combined systolic (congestive) and diastolic (congestive) heart failure: Secondary | ICD-10-CM | POA: Insufficient documentation

## 2021-11-16 LAB — BASIC METABOLIC PANEL
Anion gap: 10 (ref 5–15)
BUN: 43 mg/dL — ABNORMAL HIGH (ref 8–23)
CO2: 30 mmol/L (ref 22–32)
Calcium: 9.2 mg/dL (ref 8.9–10.3)
Chloride: 98 mmol/L (ref 98–111)
Creatinine, Ser: 1.48 mg/dL — ABNORMAL HIGH (ref 0.44–1.00)
GFR, Estimated: 39 mL/min — ABNORMAL LOW (ref >60)
Glucose, Bld: 63 mg/dL — ABNORMAL LOW (ref 70–99)
Potassium: 4.1 mmol/L (ref 3.5–5.1)
Sodium: 138 mmol/L (ref 135–145)

## 2021-11-16 LAB — BRAIN NATRIURETIC PEPTIDE: B Natriuretic Peptide: 830 pg/mL — ABNORMAL HIGH (ref 0.0–100.0)

## 2021-11-16 LAB — DIGOXIN LEVEL: Digoxin Level: <0.2 ng/mL — ABNORMAL LOW (ref 0.8–2.0)

## 2021-11-16 MED ORDER — TORSEMIDE 20 MG PO TABS: 40.0000 mg | ORAL_TABLET | Freq: Every day | ORAL | 3 refills | Status: DC

## 2021-11-16 MED ORDER — METOLAZONE 2.5 MG PO TABS: 2.5000 mg | ORAL_TABLET | Freq: Every day | ORAL | 0 refills | Status: DC

## 2021-11-16 MED ORDER — POTASSIUM CHLORIDE CRYS ER 20 MEQ PO TBCR: 20.0000 meq | EXTENDED_RELEASE_TABLET | Freq: Every day | ORAL | 6 refills | Status: DC

## 2021-11-16 NOTE — Progress Notes (Signed)
ReDS Vest / Clip - 11/16/21 1300   ? ?  ? ReDS Vest / Clip  ? Station Marker A   ? Ruler Value 29.5   ? ReDS Value Range Low volume   ? ReDS Actual Value 34   ? ?  ?  ? ?  ? ? ?

## 2021-11-16 NOTE — Patient Instructions (Signed)
Medication Changes: ? ?Increase Torsemide to 40 mg Twice daily for 3 days, then take 40 mg daily ? ?Take one dose of Metolazone 2.5mg  with (2 Tabs) of potassium ? ?Take potassium 20 meq daily for 3 days  ? ?Lab Work: ? ?Labs done today, your results will be available in MyChart, we will contact you for abnormal readings. ? ? ?Testing/Procedures: ? ?Repeat blood work in 7 days ? ?Referrals: ? ?none ? ?Special Instructions // Education: ? ?none ? ?Follow-Up in: 3 weeks and keep follow up with Dr. Gala Romney ? ?At the Advanced [facility] Failure Clinic, you and your health needs are our priority. We have a designated team specialized in the treatment of Heart Failure. This Care Team includes your primary Heart Failure Specialized Cardiologist (physician), Advanced Practice Providers (APPs- Physician Assistants and Nurse Practitioners), and Pharmacist who all work together to provide you with the care you need, when you need it.  ? ?You may see any of the following providers on your designated Care Team at your next follow up: ? ?Dr Arvilla Meres ?Dr Marca Ancona ?Tonye Becket, NP ?Robbie Lis, PA ?Jessica Milford,NP ?Anna Genre, PA ?Karle Plumber, PharmD ? ? ?Please be sure to bring in all your medications bottles to every appointment.  ? ?Need to Contact us: ? ?If you have any questions or concerns before your next appointment please send [place] a message through [place] or call our office at [phone].   ? ?TO LEAVE A MESSAGE FOR THE NURSE SELECT OPTION 2, PLEASE LEAVE A MESSAGE INCLUDING: ?YOUR NAME ?DATE OF BIRTH ?CALL BACK NUMBER ?REASON FOR CALL**this is important as we prioritize the call backs ? ?YOU WILL RECEIVE A CALL BACK THE SAME DAY AS LONG AS YOU CALL BEFORE 4:00 PM ? ? ?

## 2021-11-23 ENCOUNTER — Other Ambulatory Visit (HOSPITAL_COMMUNITY): Payer: Self-pay | Admitting: Family Medicine

## 2021-11-24 ENCOUNTER — Telehealth (HOSPITAL_COMMUNITY): Payer: Self-pay | Admitting: Cardiology

## 2021-11-24 DIAGNOSIS — I5042 Chronic combined systolic (congestive) and diastolic (congestive) heart failure: Secondary | ICD-10-CM

## 2021-11-24 LAB — BASIC METABOLIC PANEL
BUN/Creatinine Ratio: 41 — ABNORMAL HIGH (ref 12–28)
BUN: 74 mg/dL — ABNORMAL HIGH (ref 8–27)
CO2: 31 mmol/L — ABNORMAL HIGH (ref 20–29)
Calcium: 9.9 mg/dL (ref 8.7–10.3)
Chloride: 88 mmol/L — ABNORMAL LOW (ref 96–106)
Creatinine, Ser: 1.8 mg/dL — ABNORMAL HIGH (ref 0.57–1.00)
Glucose: 152 mg/dL — ABNORMAL HIGH (ref 70–99)
Potassium: 4.1 mmol/L (ref 3.5–5.2)
Sodium: 135 mmol/L (ref 134–144)
eGFR: 31 mL/min/{1.73_m2} — ABNORMAL LOW (ref >59)

## 2021-11-24 LAB — SPECIMEN STATUS REPORT

## 2021-11-24 MED ORDER — SPIRONOLACTONE 25 MG PO TABS: 12.5000 mg | ORAL_TABLET | Freq: Every day | ORAL | 3 refills | Status: DC

## 2021-11-24 NOTE — Telephone Encounter (Signed)
-----   Message from Jacklynn Ganong, [place] sent at 11/24/2021  9:44 AM EDT ----- ?Kidney function elevated after metolazone dose.  ?1.) Please hold torsemide x 2 days, then resume at 40 mg daily. ?2.) Hold Farxiga, spiro and losartan x 1 day. Then resume Farxiga 10 mg daily and losartan 12.5 mg qhs, BUT reduce spiro to 12.5 daily. ? ?Repeat BMET in 7-10 days. Do not take any further metolazone doses. ?

## 2021-11-24 NOTE — Telephone Encounter (Signed)
Patient called.  Patient aware. Pt reports she will have labs repeated at [facility] ?]order placed in epic and hardcopy mailed to patient  ?

## 2021-12-01 ENCOUNTER — Other Ambulatory Visit (HOSPITAL_COMMUNITY): Payer: Self-pay | Admitting: Family Medicine

## 2021-12-02 LAB — SPECIMEN STATUS REPORT

## 2021-12-02 LAB — BASIC METABOLIC PANEL
BUN/Creatinine Ratio: 42 — ABNORMAL HIGH (ref 12–28)
BUN: 65 mg/dL — ABNORMAL HIGH (ref 8–27)
CO2: 23 mmol/L (ref 20–29)
Calcium: 9.7 mg/dL (ref 8.7–10.3)
Chloride: 97 mmol/L (ref 96–106)
Creatinine, Ser: 1.54 mg/dL — ABNORMAL HIGH (ref 0.57–1.00)
Glucose: 98 mg/dL (ref 70–99)
Potassium: 5.1 mmol/L (ref 3.5–5.2)
Sodium: 136 mmol/L (ref 134–144)
eGFR: 37 mL/min/{1.73_m2} — ABNORMAL LOW (ref >59)

## 2021-12-14 NOTE — Progress Notes (Signed)
?Advanced Heart Failure Clinic Note  ? ? ?PCP: Jeanice Lim, PA-C ?HF Cardiologist: Dr. Shirlee Latch ? ?HPI: ?Andrea Mueller is a 66 y.o. female with hx of CAD s/p 4v CABG (11/2020), type 2 diabetes mellitus, anxiety/depression, CKD III, and ischemic cardiomyopathy. ? ?She was diagnosed with coronary artery disease back in April 2022 after presenting to a hospital in [place] with fatigue and diaphoresis.  She underwent four-vessel CABG in [place] and appeared to initially recover quite well.  She returned to her home in [place] in May 2022, and was hospitalized 5/22 for decompensated heart failure and right lower leg cellulitis.  Her echo revealed normal LV systolic function.   ? ?In July 2022, she started to become more short of breath and felt pressure in her chest.  She presented to [facility] in [place] and was found to be in acute heart failure.  Her echo then (7/22) revealed new, significant LV systolic dysfunction with moderate mitral regurgitation.  She underwent a LHC which revealed severe native disease, and occluded vein graft to a diagonal, and otherwise patent bypass grafts.  She had elevated biventricular filling pressures with a normal cardiac index, although this was on a small amount of dobutamine.  She was diuresed and inotropes wean was attempted. She was reportedly hypotensive, and inotropes were restarted.  She was transferred to [facility], on dobutamine and milrinone, for further management and advanced therapies. Inotropes were gradually weaned off with stable Co-ox's. CVP was followed closely and stable on po torsemide. Started on Farxiga, spironolactone, and losartan with stability. Renal function remained stable. VAD workup initiated. D/c weight 172 lbs. ? ?Post hospital follow up she was doing well from HF standpoint, participating in CR 3x/week. GDMT limited by low BP. ? ?Admitted 10/22 with chest pain. Cardiac work up re-assuring and symptoms resolved. Echo showed EF up to 45-50%.  Cardiology discussed inpatient stress testing vs outpatient, and together elected to follow up outpatient. ? 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  ? ?Today she returns for HF follow up. Last visit she was volume overloaded and I had her take a dose of metolazone. Overall feeling fine today. Swelling has improved. she remains SOB with increased activity and gardening. Occasional pressure/tightness across upper chest. Denies palpitations, dizziness, edema, or PND/Orthopnea. Appetite ok. No fever or chills. Weight at home 172 pounds. Taking all medications.  ? ?ECG (personally reviewed): none ordered today. ? ?Labs (10/22): LDL 26 ?Labs (12/22): K 4.4, creatinine 1.33 ?Labs (4/23): K 5.1, creatinine 1.54 ? ?PMH: ?1. CAD: CABG (4/22) with SVG-D, SVG-OM, LIMA-LAD, SVG RCA.  ?- Coronary angiography (7/22): Totally occluded mid LAD, LCx, and RCA; 80-90% stenosis small D1; patent LIMA-small, diseased LAD, patent SVG-RCA with small PDA and PLV, patent SVG-OM but 90% stenosis in small native vessel at anastomosis, occluded SVG-D.  Medical management.  ?2. Chronic systolic CHF: Ischemic cardiomyopathy.  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 ?3. CKD stage 3 ?4. Type 2 diabetes.  ?5. Anxiety ? ?Current Outpatient Medications  ?Medication Sig Dispense Refill  ? acetaminophen (TYLENOL) 325 MG tablet Take 325 mg by mouth every 6 (six) hours as needed  for moderate pain or headache.    ? aspirin 81 MG EC tablet Take 1 tablet (81 mg total) by mouth daily. Swallow whole. 30 tablet 11  ? atorvastatin (LIPITOR) 80 MG tablet Take 1 tablet  (80 mg total) by mouth daily. 30 tablet 6  ? busPIRone (BUSPAR) 5 MG tablet Take 1 tablet (5 mg total) by mouth 3 (three) times daily. 90 tablet 6  ? clopidogrel (PLAVIX) 75 MG tablet Take 1 tablet (75 mg total) by mouth daily. 90 tablet 3  ? dapagliflozin propanediol (FARXIGA) 10 MG TABS tablet Take 1 tablet (10 mg total) by mouth daily. 90 tablet 3  ? insulin NPH-regular Human (70-30) 100 UNIT/ML injection Inject 20 Units into the skin 2 (two) times daily with a meal. Patient takes 20 units in the morning and  only take 12 units at night.    ? LORazepam (ATIVAN) 0.5 MG tablet Take 0.25-0.5 mg by mouth daily as needed for anxiety.    ? losartan (COZAAR) 25 MG tablet Take 0.5 tablets (12.5 mg total) by mouth at bedtime. 45 tablet 3  ? melatonin 5 MG TABS Take 5 mg by mouth at bedtime.    ? metolazone (ZAROXOLYN) 2.5 MG tablet Take 1 tablet (2.5 mg total) by mouth daily. 5 tablet 0  ? metoprolol succinate (TOPROL XL) 25 MG 24 hr tablet Take 0.5 tablets (12.5 mg total) by mouth at bedtime. 45 tablet 3  ? Omega-3 Fatty Acids (FISH OIL) 1000 MG CAPS Take 1,000 mg by mouth daily.    ? potassium chloride SA (KLOR-CON M) 20 MEQ tablet Take 1 tablet (20 mEq total) by mouth daily. 30 tablet 6  ? senna-docusate (SENOKOT-S) 8.6-50 MG tablet Take 1 tablet by mouth daily.    ? sertraline (ZOLOFT) 50 MG tablet Take 1 tablet by mouth once daily 30 tablet 0  ? spironolactone (ALDACTONE) 25 MG tablet Take 0.5 tablets (12.5 mg total) by mouth daily. 45 tablet 3  ? torsemide (DEMADEX) 20 MG tablet Take 2 tablets (40 mg total) by mouth daily. 90 tablet 3  ? ?No current facility-administered medications for this encounter.  ? ?Allergies  ?Allergen Reactions  ? Statins   ?  Other reaction(s): Other ?Myalgia  ? Codeine Nausea Only  ? ?Social History  ? ?Socioeconomic History  ? Marital status: Widowed  ?  Spouse name: Not on file  ? Number of children: Not on file  ? Years of education: Not on file  ? Highest education level: Not on file   ?Occupational History  ? Not on file  ?Tobacco Use  ? Smoking status: Former  ?  Packs/day: 1.00  ?  Years: 15.00  ?  Pack years: 15.00  ?  Types: Cigarettes  ?  Start date: 08/16/1993  ?  Quit date: 08/16/2000  ?  Years since quitting: 21.3  ? Smokeless tobacco: Never  ?Substance and Sexual Activity  ? Alcohol use: Not Currently  ? Drug use: Not Currently  ? Sexual activity: Not Currently  ?Other Topics Concern  ? Not on file  ?Social History Narrative  ? Not on file  ? ?Social Determinants of Health  ? ?Financial Resource Strain: Medium Risk  ? Difficulty of Paying Living Expenses: Somewhat hard  ?Food Insecurity: Food Insecurity Present  ? Worried About Programme researcher, broadcasting/film/video in the Last Year: Sometimes true  ? Ran Out of Food in the Last Year: Never true  ?Transportation Needs: No Transportation Needs  ? Lack of Transportation (  Medical): No  ? Lack of Transportation (Non-Medical): No  ?Physical Activity: Not on file  ?Stress: Not on file  ?Social Connections: Not on file  ?Intimate Partner Violence: Not on file  ? ?Family history: No premature CAD.  ? ?ROS: All systems reviewed and negative except as per HPI.  ? ?BP (!) 110/50   Pulse (!) 55   Wt 79.7 kg (175 lb 12.8 oz)   SpO2 100%   BMI 31.14 kg/m?  ? ?Wt Readings from Last 3 Encounters:  ?12/15/21 79.7 kg (175 lb 12.8 oz)  ?11/16/21 86.5 kg (190 lb 9.6 oz)  ?08/13/21 76.5 kg (168 lb 9.6 oz)  ? ?PHYSICAL EXAM: ?General:  NAD. No resp difficulty ?HEENT: Normal ?Neck: Supple. No JVD. Carotids 2+ bilat; no bruits. No lymphadenopathy or thryomegaly appreciated. ?Cor: PMI nondisplaced. Regular rate & rhythm. No rubs, gallops or murmurs. ?Lungs: Clear ?Abdomen: Soft, nontender, nondistended. No hepatosplenomegaly. No bruits or masses. Good bowel sounds. ?Extremities: No cyanosis, clubbing, rash, edema ?Neuro: Alert & oriented x 3, cranial nerves grossly intact. Moves all 4 extremities w/o difficulty. Affect pleasant. ? ?ASSESSMENT & PLAN: ?1. Chronic Systolic CHF:  Ischemic cardiomyopathy.  [facility] with EF 25-30%, moderate MR.  [facility] with CHF, started on inotropes and diuresed.  RHC showed elevated filling pressures and p

## 2021-12-15 ENCOUNTER — Encounter (HOSPITAL_COMMUNITY): Payer: Self-pay

## 2021-12-15 ENCOUNTER — Ambulatory Visit (HOSPITAL_COMMUNITY)
Admission: RE | Admit: 2021-12-15 | Discharge: 2021-12-15 | Disposition: A | Discharge status: Home / Self Care | Payer: 59 | Source: Ambulatory Visit | Attending: Family Medicine | Admitting: Family Medicine

## 2021-12-15 VITALS — BP 110/50 | HR 55 | Wt 175.8 lb

## 2021-12-15 DIAGNOSIS — I25810 Atherosclerosis of coronary artery bypass graft(s) without angina pectoris: Secondary | ICD-10-CM | POA: Insufficient documentation

## 2021-12-15 DIAGNOSIS — E1122 Type 2 diabetes mellitus with diabetic chronic kidney disease: Secondary | ICD-10-CM | POA: Diagnosis not present

## 2021-12-15 DIAGNOSIS — Z7901 Long term (current) use of anticoagulants: Secondary | ICD-10-CM | POA: Insufficient documentation

## 2021-12-15 DIAGNOSIS — I255 Ischemic cardiomyopathy: Secondary | ICD-10-CM | POA: Diagnosis not present

## 2021-12-15 DIAGNOSIS — I340 Nonrheumatic mitral (valve) insufficiency: Secondary | ICD-10-CM | POA: Diagnosis not present

## 2021-12-15 DIAGNOSIS — Z7984 Long term (current) use of oral hypoglycemic drugs: Secondary | ICD-10-CM | POA: Insufficient documentation

## 2021-12-15 DIAGNOSIS — Z7982 Long term (current) use of aspirin: Secondary | ICD-10-CM | POA: Insufficient documentation

## 2021-12-15 DIAGNOSIS — Z7902 Long term (current) use of antithrombotics/antiplatelets: Secondary | ICD-10-CM | POA: Insufficient documentation

## 2021-12-15 DIAGNOSIS — F419 Anxiety disorder, unspecified: Secondary | ICD-10-CM | POA: Insufficient documentation

## 2021-12-15 DIAGNOSIS — R0789 Other chest pain: Secondary | ICD-10-CM | POA: Insufficient documentation

## 2021-12-15 DIAGNOSIS — N1831 Chronic kidney disease, stage 3a: Secondary | ICD-10-CM | POA: Insufficient documentation

## 2021-12-15 DIAGNOSIS — R0602 Shortness of breath: Secondary | ICD-10-CM | POA: Diagnosis present

## 2021-12-15 DIAGNOSIS — F32A Depression, unspecified: Secondary | ICD-10-CM | POA: Insufficient documentation

## 2021-12-15 DIAGNOSIS — I2510 Atherosclerotic heart disease of native coronary artery without angina pectoris: Secondary | ICD-10-CM | POA: Insufficient documentation

## 2021-12-15 DIAGNOSIS — Z79899 Other long term (current) drug therapy: Secondary | ICD-10-CM | POA: Diagnosis not present

## 2021-12-15 DIAGNOSIS — I5042 Chronic combined systolic (congestive) and diastolic (congestive) heart failure: Secondary | ICD-10-CM | POA: Diagnosis not present

## 2021-12-15 LAB — BASIC METABOLIC PANEL
Anion gap: 8 (ref 5–15)
BUN: 38 mg/dL — ABNORMAL HIGH (ref 8–23)
CO2: 29 mmol/L (ref 22–32)
Calcium: 9.6 mg/dL (ref 8.9–10.3)
Chloride: 102 mmol/L (ref 98–111)
Creatinine, Ser: 1.35 mg/dL — ABNORMAL HIGH (ref 0.44–1.00)
GFR, Estimated: 44 mL/min — ABNORMAL LOW (ref >60)
Glucose, Bld: 96 mg/dL (ref 70–99)
Potassium: 4.7 mmol/L (ref 3.5–5.1)
Sodium: 139 mmol/L (ref 135–145)

## 2021-12-15 LAB — BRAIN NATRIURETIC PEPTIDE: B Natriuretic Peptide: 593.7 pg/mL — ABNORMAL HIGH (ref 0.0–100.0)

## 2021-12-15 NOTE — Patient Instructions (Signed)
Thank you for coming in today ? ?Labs were done today, if any labs are abnormal the clinic will call you ?No news is good news ? ?Your physician recommends that you schedule a follow-up appointment in:  ?Keep follow up as scheduled with Dr. Shirlee Latch ? ?At the [facility], you and your health needs are our priority. As part of our continuing mission to provide you with exceptional heart care, we have created designated Provider Care Teams. These Care Teams include your primary Cardiologist (physician) and Advanced Practice Providers (APPs- Physician Assistants and Nurse Practitioners) who all work together to provide you with the care you need, when you need it.  ? ?You may see any of the following providers on your designated Care Team at your next follow up: ?Dr Arvilla Meres ?Dr Marca Ancona ?Tonye Becket, NP ?Robbie Lis, PA ?Jessica Milford,NP ?Anna Genre, PA ?Karle Plumber, PharmD ? ? ?Please be sure to bring in all your medications bottles to every appointment.  ? ?If you have any questions or concerns before your next appointment please send [place] a message through [place] or call our office at [phone].   ? ?TO LEAVE A MESSAGE FOR THE NURSE SELECT OPTION 2, PLEASE LEAVE A MESSAGE INCLUDING: ?YOUR NAME ?DATE OF BIRTH ?CALL BACK NUMBER ?REASON FOR CALL**this is important as we prioritize the call backs ? ?YOU WILL RECEIVE A CALL BACK THE SAME DAY AS LONG AS YOU CALL BEFORE 4:00 PM ? ?

## 2022-01-12 ENCOUNTER — Other Ambulatory Visit (HOSPITAL_COMMUNITY): Payer: Self-pay | Admitting: Adult Health

## 2022-02-05 ENCOUNTER — Encounter (HOSPITAL_COMMUNITY): Payer: Self-pay | Admitting: Cardiology

## 2022-02-05 MED ORDER — TORSEMIDE 20 MG PO TABS: 40.0000 mg | ORAL_TABLET | Freq: Every day | ORAL | 3 refills | Status: DC

## 2022-02-09 ENCOUNTER — Ambulatory Visit (HOSPITAL_COMMUNITY)
Admission: RE | Admit: 2022-02-09 | Discharge: 2022-02-09 | Disposition: A | Discharge status: Home / Self Care | Payer: 59 | Source: Ambulatory Visit | Attending: Cardiology | Admitting: Cardiology

## 2022-02-09 VITALS — BP 106/60 | HR 53 | Wt 177.2 lb

## 2022-02-09 DIAGNOSIS — I4510 Unspecified right bundle-branch block: Secondary | ICD-10-CM | POA: Insufficient documentation

## 2022-02-09 DIAGNOSIS — I252 Old myocardial infarction: Secondary | ICD-10-CM | POA: Insufficient documentation

## 2022-02-09 DIAGNOSIS — N1831 Chronic kidney disease, stage 3a: Secondary | ICD-10-CM | POA: Insufficient documentation

## 2022-02-09 DIAGNOSIS — Z7902 Long term (current) use of antithrombotics/antiplatelets: Secondary | ICD-10-CM | POA: Diagnosis not present

## 2022-02-09 DIAGNOSIS — I340 Nonrheumatic mitral (valve) insufficiency: Secondary | ICD-10-CM | POA: Diagnosis not present

## 2022-02-09 DIAGNOSIS — I255 Ischemic cardiomyopathy: Secondary | ICD-10-CM | POA: Insufficient documentation

## 2022-02-09 DIAGNOSIS — I25810 Atherosclerosis of coronary artery bypass graft(s) without angina pectoris: Secondary | ICD-10-CM | POA: Insufficient documentation

## 2022-02-09 DIAGNOSIS — I2510 Atherosclerotic heart disease of native coronary artery without angina pectoris: Secondary | ICD-10-CM | POA: Diagnosis not present

## 2022-02-09 DIAGNOSIS — Z7984 Long term (current) use of oral hypoglycemic drugs: Secondary | ICD-10-CM | POA: Insufficient documentation

## 2022-02-09 DIAGNOSIS — Z79899 Other long term (current) drug therapy: Secondary | ICD-10-CM | POA: Insufficient documentation

## 2022-02-09 DIAGNOSIS — Z7982 Long term (current) use of aspirin: Secondary | ICD-10-CM | POA: Insufficient documentation

## 2022-02-09 DIAGNOSIS — I5042 Chronic combined systolic (congestive) and diastolic (congestive) heart failure: Secondary | ICD-10-CM

## 2022-02-09 DIAGNOSIS — E1122 Type 2 diabetes mellitus with diabetic chronic kidney disease: Secondary | ICD-10-CM | POA: Insufficient documentation

## 2022-02-09 DIAGNOSIS — Z794 Long term (current) use of insulin: Secondary | ICD-10-CM | POA: Insufficient documentation

## 2022-02-10 NOTE — Progress Notes (Signed)
Advanced Heart Failure Clinic Note    PCP: Jeanice Lim, PA-C HF Cardiologist: Dr. Shirlee Latch  HPI: Andrea Mueller is a 66 y.o. female with hx of CAD s/p 4v CABG (11/2020), type 2 diabetes mellitus, anxiety/depression, CKD III, and ischemic cardiomyopathy.  She was diagnosed with coronary artery disease back in April 2022 after presenting to a hospital in [place] with fatigue and diaphoresis.  She underwent four-vessel CABG in [place] and appeared to initially recover quite well.  She returned to her home in [place] in May 2022, and was hospitalized 5/22 for decompensated heart failure and right lower leg cellulitis.  Her echo revealed normal LV systolic function.    In July 2022, she started to become more short of breath and felt pressure in her chest.  She presented to [facility] in [place] and was found to be in acute heart failure.  Her echo then (7/22) revealed new, significant LV systolic dysfunction with moderate mitral regurgitation.  She underwent a LHC which revealed severe native disease, and occluded vein graft to a diagonal, and otherwise patent bypass grafts.  She had elevated biventricular filling pressures with a normal cardiac index, although this was on a small amount of dobutamine.  She was diuresed and inotropes wean was attempted. She was reportedly hypotensive, and inotropes were restarted.  She was transferred to [facility], on dobutamine and milrinone, for further management and advanced therapies. Inotropes were gradually weaned off with stable Co-ox's. CVP was followed closely and stable on po torsemide. Started on Farxiga, spironolactone, and losartan with stability. Renal function remained stable. VAD workup initiated. D/c weight 172 lbs.  Post hospital follow up she was doing well from HF standpoint, participating in CR 3x/week. GDMT limited by low BP.  Admitted 10/22 with chest pain. Cardiac work up re-assuring and symptoms resolved. Echo showed EF up to 45-50%.  Cardiology discussed inpatient stress testing vs outpatient, and together elected to follow up outpatient.  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   Today she returns for HF followup.  Generally doing well.  Stamina is not as good in the heat.  No dyspnea walking on flat ground.  Able to garden.  No lightheadedness.  No chest pain/tightness.  Weight stable.   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   Labs (10/22): LDL 26 Labs (12/22): K 4.4, creatinine 1.33 Labs (4/23): K 5.1, creatinine 1.54 Labs (6/23): LDL 32, K 5, creatinine 1.32  PMH: 1. CAD: CABG (4/22) with SVG-D, SVG-OM, LIMA-LAD, SVG RCA.  - Coronary angiography (7/22): Totally occluded mid LAD, LCx, and RCA; 80-90% stenosis small D1; patent LIMA-small, diseased LAD, patent SVG-RCA with small PDA and PLV, patent SVG-OM but 90% stenosis in small native vessel at anastomosis, occluded SVG-D.  Medical management.  2. Chronic systolic CHF: Ischemic cardiomyopathy.  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 3. CKD stage 3 4. Type 2 diabetes.  5. Anxiety  Current Outpatient Medications  Medication Sig Dispense Refill   acetaminophen (TYLENOL) 325 MG tablet Take 325 mg by mouth every 6 (six) hours as needed for moderate pain or headache.  aspirin 81 MG EC tablet Take 1 tablet (81 mg total) by mouth daily. Swallow whole. 30 tablet 11   atorvastatin (LIPITOR) 80 MG tablet Take 1 tablet (80 mg total) by mouth daily. 30 tablet 6   busPIRone (BUSPAR) 5 MG tablet TAKE 1 TABLET BY MOUTH THREE TIMES DAILY 90 tablet  0   clopidogrel (PLAVIX) 75 MG tablet Take 1 tablet (75 mg total) by mouth daily. 90 tablet 3   dapagliflozin propanediol (FARXIGA) 10 MG TABS tablet Take 1 tablet (10 mg total) by mouth daily. 90 tablet 3   dicyclomine (BENTYL) 10 MG capsule Take 10 mg by mouth 4 (four) times daily.     insulin NPH-regular Human (70-30) 100 UNIT/ML injection Inject 20 Units into the skin 2 (two) times daily with a meal. Patient takes 20 units in the morning and  only take 12 units at night.     LORazepam (ATIVAN) 0.5 MG tablet Take 0.25-0.5 mg by mouth daily as needed for anxiety.     losartan (COZAAR) 25 MG tablet Take 0.5 tablets (12.5 mg total) by mouth at bedtime. 45 tablet 3   melatonin 5 MG TABS Take 5 mg by mouth at bedtime.     metolazone (ZAROXOLYN) 2.5 MG tablet Take 1 tablet (2.5 mg total) by mouth daily. (Patient taking differently: Take 2.5 mg by mouth daily. Only as directed by AHF clinic) 5 tablet 0   metoprolol succinate (TOPROL XL) 25 MG 24 hr tablet Take 0.5 tablets (12.5 mg total) by mouth at bedtime. 45 tablet 3   Omega-3 Fatty Acids (FISH OIL) 1000 MG CAPS Take 1,000 mg by mouth daily.     senna-docusate (SENOKOT-S) 8.6-50 MG tablet Take 1 tablet by mouth daily.     sertraline (ZOLOFT) 50 MG tablet Take 1 tablet by mouth once daily 30 tablet 0   spironolactone (ALDACTONE) 25 MG tablet Take 0.5 tablets (12.5 mg total) by mouth daily. 45 tablet 3   torsemide (DEMADEX) 20 MG tablet Take 2 tablets (40 mg total) by mouth daily. 180 tablet 3   No current facility-administered medications for this encounter.   Allergies  Allergen Reactions   Statins     Other reaction(s): Other Myalgia   Codeine Nausea Only   Social History   Socioeconomic History   Marital status: Widowed    Spouse name: Not on file   Number of children: Not on file   Years of education: Not on file   Highest education level: Not on file  Occupational History   Not on file  Tobacco Use   Smoking status: Former     Packs/day: 1.00    Years: 15.00    Total pack years: 15.00    Types: Cigarettes    Start date: 08/16/1993    Quit date: 08/16/2000    Years since quitting: 21.5   Smokeless tobacco: Never  Substance and Sexual Activity   Alcohol use: Not Currently   Drug use: Not Currently   Sexual activity: Not Currently  Other Topics Concern   Not on file  Social History Narrative   Not on file   Social Determinants of Health   Financial Resource Strain: Medium Risk (04/22/2021)   Overall Financial Resource Strain (CARDIA)    Difficulty of Paying Living Expenses: Somewhat hard  Food Insecurity: Food Insecurity Present (04/22/2021)   Hunger Vital Sign    Worried About Running Out of Food in the Last Year: Sometimes true    Ran Out of Food in  the Last Year: Never true  Transportation Needs: No Transportation Needs (03/18/2021)   PRAPARE - Hydrologist (Medical): No    Lack of Transportation (Non-Medical): No  Physical Activity: Not on file  Stress: Not on file  Social Connections: Not on file  Intimate Partner Violence: Not on file   Family history: No premature CAD.   ROS: All systems reviewed and negative except as per HPI.   BP 106/60   Pulse (!) 53   Wt 80.4 kg (177 lb 3.2 oz)   SpO2 98%   BMI 31.39 kg/m   Wt Readings from Last 3 Encounters:  02/09/22 80.4 kg (177 lb 3.2 oz)  12/15/21 79.7 kg (175 lb 12.8 oz)  11/16/21 86.5 kg (190 lb 9.6 oz)   PHYSICAL EXAM: General: NAD Neck: No JVD, no thyromegaly or thyroid nodule.  Lungs: Clear to auscultation bilaterally with normal respiratory effort. CV: Nondisplaced PMI.  Heart regular S1/S2, no S3/S4, no murmur.  No peripheral edema.  No carotid bruit.  Normal pedal pulses.  Abdomen: Soft, nontender, no hepatosplenomegaly, no distention.  Skin: Intact without lesions or rashes.  Neurologic: Alert and oriented x 3.  Psych: Normal affect. Extremities: No clubbing or cyanosis.  HEENT: Normal.   ASSESSMENT &  PLAN: 1. Chronic Systolic CHF: Ischemic cardiomyopathy.  Echo at [facility] in 7/22 with EF 25-30%, moderate MR.  She was admitted to [facility] in 7/22 with CHF, started on inotropes and diuresed.  RHC showed elevated filling pressures and preserved CO (but was on inotropes at the time).  Unable to wean off inotropes successfully, sent to [facility] for consideration of LVAD. Able to wean off dobutamine and milrinone successfully.  CPX showed mild HF limitation. Echo 10/22 showed EF up to 45-50%. Echo 12/22 with EF 50-55% with small apical aneurysm and no thrombus. Stable NYHA II, she is not volume overloaded on exam. GDMT has been limited by hypotension/orthostasis.  - Continue torsemide 40 mg daily. BMET today. - Continue Toprol XL 12.5 mg qhs. HR 53 today. - Continue losartan 12.5 mg qhs. - Continue Farxiga 10 mg daily. No GU symptoms. - Continue spironolactone 12.5 mg daily.  2. CAD: s/p CABG x 4 (4/22).  Cath in 7/22 with 3/4 grafts patent, occluded SVG-small D.  Native vessels, however, were small and diseased (diabetic coronaries). No chest pain.  - Continue ASA 81 + Plavix. - Continue atorvastatin, good lipids in 6/23. 3. CKD stage 3a: BMET today.   Followup 4 months APP.   Loralie Champagne, MD 02/10/22

## 2022-04-02 ENCOUNTER — Other Ambulatory Visit (HOSPITAL_COMMUNITY): Payer: Self-pay | Admitting: Adult Health

## 2022-06-11 ENCOUNTER — Encounter (HOSPITAL_COMMUNITY): Payer: Self-pay

## 2022-06-11 ENCOUNTER — Ambulatory Visit (HOSPITAL_COMMUNITY)
Admission: RE | Admit: 2022-06-11 | Discharge: 2022-06-11 | Disposition: A | Discharge status: Home / Self Care | Payer: Medicare Other | Source: Ambulatory Visit | Attending: Internal Medicine | Admitting: Internal Medicine

## 2022-06-11 VITALS — BP 108/52 | HR 53 | Wt 186.2 lb

## 2022-06-11 DIAGNOSIS — Z5986 Financial insecurity: Secondary | ICD-10-CM | POA: Diagnosis not present

## 2022-06-11 DIAGNOSIS — Z87891 Personal history of nicotine dependence: Secondary | ICD-10-CM | POA: Diagnosis not present

## 2022-06-11 DIAGNOSIS — Z7902 Long term (current) use of antithrombotics/antiplatelets: Secondary | ICD-10-CM | POA: Insufficient documentation

## 2022-06-11 DIAGNOSIS — Z5941 Food insecurity: Secondary | ICD-10-CM | POA: Insufficient documentation

## 2022-06-11 DIAGNOSIS — F419 Anxiety disorder, unspecified: Secondary | ICD-10-CM | POA: Diagnosis not present

## 2022-06-11 DIAGNOSIS — I255 Ischemic cardiomyopathy: Secondary | ICD-10-CM | POA: Insufficient documentation

## 2022-06-11 DIAGNOSIS — Z794 Long term (current) use of insulin: Secondary | ICD-10-CM | POA: Diagnosis not present

## 2022-06-11 DIAGNOSIS — I25810 Atherosclerosis of coronary artery bypass graft(s) without angina pectoris: Secondary | ICD-10-CM | POA: Diagnosis not present

## 2022-06-11 DIAGNOSIS — Z7984 Long term (current) use of oral hypoglycemic drugs: Secondary | ICD-10-CM | POA: Diagnosis not present

## 2022-06-11 DIAGNOSIS — N1831 Chronic kidney disease, stage 3a: Secondary | ICD-10-CM | POA: Diagnosis not present

## 2022-06-11 DIAGNOSIS — I5042 Chronic combined systolic (congestive) and diastolic (congestive) heart failure: Secondary | ICD-10-CM

## 2022-06-11 DIAGNOSIS — Z79899 Other long term (current) drug therapy: Secondary | ICD-10-CM | POA: Insufficient documentation

## 2022-06-11 DIAGNOSIS — I5022 Chronic systolic (congestive) heart failure: Secondary | ICD-10-CM | POA: Diagnosis present

## 2022-06-11 DIAGNOSIS — F32A Depression, unspecified: Secondary | ICD-10-CM | POA: Diagnosis not present

## 2022-06-11 DIAGNOSIS — E1122 Type 2 diabetes mellitus with diabetic chronic kidney disease: Secondary | ICD-10-CM | POA: Diagnosis not present

## 2022-06-11 DIAGNOSIS — I2510 Atherosclerotic heart disease of native coronary artery without angina pectoris: Secondary | ICD-10-CM | POA: Insufficient documentation

## 2022-06-11 DIAGNOSIS — I340 Nonrheumatic mitral (valve) insufficiency: Secondary | ICD-10-CM | POA: Insufficient documentation

## 2022-06-11 LAB — BASIC METABOLIC PANEL
Anion gap: 7 (ref 5–15)
BUN: 41 mg/dL — ABNORMAL HIGH (ref 8–23)
CO2: 28 mmol/L (ref 22–32)
Calcium: 9 mg/dL (ref 8.9–10.3)
Chloride: 101 mmol/L (ref 98–111)
Creatinine, Ser: 1.66 mg/dL — ABNORMAL HIGH (ref 0.44–1.00)
GFR, Estimated: 34 mL/min — ABNORMAL LOW (ref >60)
Glucose, Bld: 217 mg/dL — ABNORMAL HIGH (ref 70–99)
Potassium: 4.4 mmol/L (ref 3.5–5.1)
Sodium: 136 mmol/L (ref 135–145)

## 2022-06-11 NOTE — Patient Instructions (Addendum)
Thank you for coming in today  Labs were done today, if any labs are abnormal the clinic will call you No news is good news  Your physician recommends that you schedule a follow-up appointment in:  3-4 months with Dr. Aundra Dubin with echocardiogram  Your physician has requested that you have an echocardiogram. Echocardiography is a painless test that uses sound waves to create images of your heart. It provides your doctor with information about the size and shape of your heart and how well your heart's chambers and valves are working. This procedure takes approximately one hour. There are no restrictions for this procedure.  PLEASE WEAR COMPRESSION HOSE.  At the [facility], you and your health needs are our priority. As part of our continuing mission to provide you with exceptional heart care, we have created designated Provider Care Teams. These Care Teams include your primary Cardiologist (physician) and Advanced Practice Providers (APPs- Physician Assistants and Nurse Practitioners) who all work together to provide you with the care you need, when you need it.   You may see any of the following providers on your designated Care Team at your next follow up: Dr Glori Bickers Dr Loralie Champagne Dr. Roxana Hires, NP Lyda Jester, [place] [facility] [place], [place] Forestine Na, NP Audry Riles, PharmD   Please be sure to bring in all your medications bottles to every appointment.   If you have any questions or concerns before your next appointment please send [place] a message through [place] or call our office at [phone].    TO LEAVE A MESSAGE FOR THE NURSE SELECT OPTION 2, PLEASE LEAVE A MESSAGE INCLUDING: YOUR NAME DATE OF BIRTH CALL BACK NUMBER REASON FOR CALL**this is important as we prioritize the call backs  YOU WILL RECEIVE A CALL BACK THE SAME DAY AS LONG AS YOU CALL BEFORE 4:00 PM

## 2022-06-11 NOTE — Progress Notes (Signed)
ReDS Vest / Clip - 06/11/22 1200       ReDS Vest / Clip   Station Marker A    Ruler Value 29    ReDS Value Range Low volume    ReDS Actual Value 30

## 2022-06-11 NOTE — Progress Notes (Signed)
Advanced Heart Failure Clinic Note    PCP: Jeanice Lim, PA-C HF Cardiologist: Dr. Shirlee Latch  HPI: Andrea Mueller is a 66 y.o. female with hx of CAD s/p 4v CABG (11/2020), type 2 diabetes mellitus, anxiety/depression, CKD III, and ischemic cardiomyopathy.  She was diagnosed with coronary artery disease back in April 2022 after presenting to a hospital in [place] with fatigue and diaphoresis.  She underwent four-vessel CABG in [place] and appeared to initially recover quite well.  She returned to her home in [place] in May 2022, and was hospitalized 5/22 for decompensated heart failure and right lower leg cellulitis.  Her echo revealed normal LV systolic function.    In July 2022, she started to become more short of breath and felt pressure in her chest.  She presented to [facility] in [place] and was found to be in acute heart failure.  Her echo then (7/22) revealed new, significant LV systolic dysfunction with moderate mitral regurgitation.  She underwent a LHC which revealed severe native disease, and occluded vein graft to a diagonal, and otherwise patent bypass grafts.  She had elevated biventricular filling pressures with a normal cardiac index, although this was on a small amount of dobutamine.  She was diuresed and inotropes wean was attempted. She was reportedly hypotensive, and inotropes were restarted.  She was transferred to [facility], on dobutamine and milrinone, for further management and advanced therapies. Inotropes were gradually weaned off with stable Co-ox's. CVP was followed closely and stable on po torsemide. Started on Farxiga, spironolactone, and losartan with stability. Renal function remained stable. VAD workup initiated. D/c weight 172 lbs.  Post hospital follow up she was doing well from HF standpoint, participating in CR 3x/week. GDMT limited by low BP.  Admitted 10/22 with chest pain. Cardiac work up re-assuring and symptoms resolved. Echo showed EF up to 45-50%.  Cardiology discussed inpatient stress testing vs outpatient, and together elected to follow up outpatient.  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   Today she returns for HF follow up with son and daughter. Overall feeling fine. She has dyspnea when she pushes herself, but no SOB walking on flat ground or pushing cart ar grocery store. Ankles are swelling today. She turned 32 yesterday and celebrated with [place] food. Rare atypical chest tightness.Denies palpitations, abnormal bleeding, CP, dizziness, or PND/Orthopnea. Appetite ok. No fever or chills. Weight at home 178-184 pounds. Taking all medications.    ReDs: 30%  Labs (10/22): LDL 26 Labs (12/22): K 4.4, creatinine 1.33 Labs (4/23): K 5.1, creatinine 1.54 Labs (6/23): LDL 32, K 5, creatinine 1.32 Labs (10/23): K 5.4, creatinine 1.36  PMH: 1. CAD: CABG (4/22) with SVG-D, SVG-OM, LIMA-LAD, SVG RCA.  - Coronary angiography (7/22): Totally occluded mid LAD, LCx, and RCA; 80-90% stenosis small D1; patent LIMA-small, diseased LAD, patent SVG-RCA with small PDA and PLV, patent SVG-OM but 90% stenosis in small native vessel at anastomosis, occluded SVG-D.  Medical management.  2. Chronic systolic CHF: Ischemic cardiomyopathy.  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 3. CKD stage 3.  4. Type 2 diabetes.  5. Anxiety  Current Outpatient Medications  Medication Sig Dispense Refill   acetaminophen (TYLENOL) 325 MG tablet Take 325 mg by mouth every 6 (six) hours as needed for moderate pain or headache.     aspirin 81 MG EC tablet Take 1 tablet (81 mg  total) by mouth daily. Swallow whole. 30 tablet 11   atorvastatin (LIPITOR) 80 MG tablet Take 1 tablet by mouth once daily 30 tablet 11   busPIRone (BUSPAR) 5 MG tablet TAKE 1 TABLET BY MOUTH THREE TIMES DAILY 90 tablet 0   clopidogrel (PLAVIX) 75 MG tablet Take 1 tablet (75 mg total) by mouth daily. 90 tablet 3   dapagliflozin propanediol (FARXIGA) 10 MG TABS tablet Take 1 tablet (10 mg total) by mouth daily. 90 tablet 3   insulin NPH-regular Human (70-30) 100 UNIT/ML injection Inject 20 Units into the skin 2 (two) times daily with a meal. Patient takes 20 units in the morning and  only take 12 units at night.     LORazepam (ATIVAN) 0.5 MG tablet Take 0.25-0.5 mg by mouth daily as needed for anxiety.     losartan (COZAAR) 25 MG tablet Take 0.5 tablets (12.5 mg total) by mouth at bedtime. 45 tablet 3   melatonin 5 MG TABS Take 5 mg by mouth at bedtime.     metolazone (ZAROXOLYN) 2.5 MG tablet Take 1 tablet (2.5 mg total) by mouth daily. (Patient taking differently: Take 2.5 mg by mouth daily. Only as directed by AHF clinic) 5 tablet 0   metoprolol succinate (TOPROL XL) 25 MG 24 hr tablet Take 0.5 tablets (12.5 mg total) by mouth at bedtime. 45 tablet 3   Omega-3 Fatty Acids (FISH OIL) 1000 MG CAPS Take 1,000 mg by mouth daily.     senna-docusate (SENOKOT-S) 8.6-50 MG tablet Take 1 tablet by mouth daily.     sertraline (ZOLOFT) 50 MG tablet Take 1 tablet by mouth once daily 30 tablet 0   torsemide (DEMADEX) 20 MG tablet Take 2 tablets (40 mg total) by mouth daily. 180 tablet 3   No current facility-administered medications for this encounter.   Allergies  Allergen Reactions   Statins     Other reaction(s): Other Myalgia   Codeine Nausea Only   Social History   Socioeconomic History   Marital status: Widowed    Spouse name: Not on file   Number of children: Not on file   Years of education: Not on file   Highest education level: Not on file  Occupational History   Not on file   Tobacco Use   Smoking status: Former    Packs/day: 1.00    Years: 15.00    Total pack years: 15.00    Types: Cigarettes    Start date: 08/16/1993    Quit date: 08/16/2000    Years since quitting: 21.8   Smokeless tobacco: Never  Substance and Sexual Activity   Alcohol use: Not Currently   Drug use: Not Currently   Sexual activity: Not Currently  Other Topics Concern   Not on file  Social History Narrative   Not on file   Social Determinants of Health   Financial Resource Strain: Medium Risk (04/22/2021)   Overall Financial Resource Strain (CARDIA)    Difficulty of Paying Living Expenses: Somewhat hard  Food Insecurity: Food Insecurity Present (04/22/2021)   Hunger Vital Sign    Worried About Running Out of Food in the Last Year: Sometimes true    Ran Out of Food in the Last Year: Never true  Transportation Needs:  No Transportation Needs (03/18/2021)   PRAPARE - Hydrologist (Medical): No    Lack of Transportation (Non-Medical): No  Physical Activity: Not on file  Stress: Not on file  Social Connections: Not on file  Intimate Partner Violence: Not on file   Family history: No premature CAD.   ROS: All systems reviewed and negative except as per HPI.   BP (!) 108/52   Pulse (!) 53   Wt 84.5 kg (186 lb 3.2 oz)   SpO2 98%   BMI 32.98 kg/m   Wt Readings from Last 3 Encounters:  06/11/22 84.5 kg (186 lb 3.2 oz)  02/09/22 80.4 kg (177 lb 3.2 oz)  12/15/21 79.7 kg (175 lb 12.8 oz)   PHYSICAL EXAM: General:  NAD. No resp difficulty, walked into clinic HEENT: Normal Neck: Supple. No JVD. Carotids 2+ bilat; no bruits. No lymphadenopathy or thryomegaly appreciated. Cor: PMI nondisplaced. Regular rate & rhythm. No rubs, gallops or murmurs. Lungs: Clear Abdomen: Soft, nontender, nondistended. No hepatosplenomegaly. No bruits or masses. Good bowel sounds. Extremities: No cyanosis, clubbing, rash, 1+ pedal edema Neuro: Alert & oriented x 3, cranial  nerves grossly intact. Moves all 4 extremities w/o difficulty. Affect pleasant.  ASSESSMENT & PLAN: 1. Chronic Systolic CHF: Ischemic cardiomyopathy.  Echo at [facility] in 7/22 with EF 25-30%, moderate MR.  She was admitted to [facility] in 7/22 with CHF, started on inotropes and diuresed.  RHC showed elevated filling pressures and preserved CO (but was on inotropes at the time).  Unable to wean off inotropes successfully, sent to [facility] for consideration of LVAD. Able to wean off dobutamine and milrinone successfully.  CPX showed mild HF limitation. Echo 10/22 showed EF up to 45-50%. Echo 12/22 with EF 50-55% with small apical aneurysm and no thrombus. Stable NYHA II, she is not volume overloaded on exam, mild ankle edema. GDMT has been limited by hypotension/orthostasis.  - Advised she wear her compression hose. - Continue torsemide 40 mg daily. BMET today. - Continue Toprol XL 12.5 mg qhs.  - Continue losartan 12.5 mg qhs. - Continue Farxiga 10 mg daily. No GU symptoms. - Continue spironolactone 12.5 mg daily.  - Update echo next visit to ensure EF stability. 2. CAD: s/p CABG x 4 (4/22).  Cath in 7/22 with 3/4 grafts patent, occluded SVG-small D.  Native vessels, however, were small and diseased (diabetic coronaries). No ischemic chest pain.  - Continue ASA 81 + Plavix. - Continue atorvastatin, good lipids 6/23. 3. CKD stage 3a: BMET today. Pt states she has referral to nephrology on 06/28/22.  Follow up 3-4 months Dr. Aundra Dubin + echo.  [place], FNP 06/11/22

## 2022-08-18 ENCOUNTER — Other Ambulatory Visit (HOSPITAL_COMMUNITY): Payer: Self-pay

## 2022-08-18 DIAGNOSIS — I5042 Chronic combined systolic (congestive) and diastolic (congestive) heart failure: Secondary | ICD-10-CM

## 2022-09-20 ENCOUNTER — Ambulatory Visit (HOSPITAL_BASED_OUTPATIENT_CLINIC_OR_DEPARTMENT_OTHER)
Admission: RE | Admit: 2022-09-20 | Discharge: 2022-09-20 | Disposition: A | Discharge status: Home / Self Care | Payer: Medicare Other | Source: Ambulatory Visit | Attending: Cardiology | Admitting: Cardiology

## 2022-09-20 ENCOUNTER — Encounter (HOSPITAL_COMMUNITY): Payer: Self-pay | Admitting: Cardiology

## 2022-09-20 ENCOUNTER — Ambulatory Visit (HOSPITAL_COMMUNITY)
Admission: RE | Admit: 2022-09-20 | Discharge: 2022-09-20 | Disposition: A | Discharge status: Home / Self Care | Payer: Medicare Other | Source: Ambulatory Visit | Attending: Cardiology | Admitting: Cardiology

## 2022-09-20 VITALS — BP 110/60 | HR 52 | Wt 189.0 lb

## 2022-09-20 DIAGNOSIS — E1122 Type 2 diabetes mellitus with diabetic chronic kidney disease: Secondary | ICD-10-CM | POA: Insufficient documentation

## 2022-09-20 DIAGNOSIS — I255 Ischemic cardiomyopathy: Secondary | ICD-10-CM | POA: Insufficient documentation

## 2022-09-20 DIAGNOSIS — I5042 Chronic combined systolic (congestive) and diastolic (congestive) heart failure: Secondary | ICD-10-CM | POA: Diagnosis present

## 2022-09-20 DIAGNOSIS — Z951 Presence of aortocoronary bypass graft: Secondary | ICD-10-CM | POA: Insufficient documentation

## 2022-09-20 DIAGNOSIS — Z7984 Long term (current) use of oral hypoglycemic drugs: Secondary | ICD-10-CM | POA: Diagnosis not present

## 2022-09-20 DIAGNOSIS — I130 Hypertensive heart and chronic kidney disease with heart failure and stage 1 through stage 4 chronic kidney disease, or unspecified chronic kidney disease: Secondary | ICD-10-CM | POA: Insufficient documentation

## 2022-09-20 DIAGNOSIS — F419 Anxiety disorder, unspecified: Secondary | ICD-10-CM | POA: Insufficient documentation

## 2022-09-20 DIAGNOSIS — Z5941 Food insecurity: Secondary | ICD-10-CM | POA: Insufficient documentation

## 2022-09-20 DIAGNOSIS — I2510 Atherosclerotic heart disease of native coronary artery without angina pectoris: Secondary | ICD-10-CM | POA: Insufficient documentation

## 2022-09-20 DIAGNOSIS — N183 Chronic kidney disease, stage 3 unspecified: Secondary | ICD-10-CM | POA: Insufficient documentation

## 2022-09-20 DIAGNOSIS — Z794 Long term (current) use of insulin: Secondary | ICD-10-CM | POA: Diagnosis not present

## 2022-09-20 DIAGNOSIS — Z7982 Long term (current) use of aspirin: Secondary | ICD-10-CM | POA: Insufficient documentation

## 2022-09-20 DIAGNOSIS — Z7902 Long term (current) use of antithrombotics/antiplatelets: Secondary | ICD-10-CM | POA: Diagnosis not present

## 2022-09-20 DIAGNOSIS — F32A Depression, unspecified: Secondary | ICD-10-CM | POA: Diagnosis not present

## 2022-09-20 DIAGNOSIS — I25810 Atherosclerosis of coronary artery bypass graft(s) without angina pectoris: Secondary | ICD-10-CM | POA: Insufficient documentation

## 2022-09-20 DIAGNOSIS — I5022 Chronic systolic (congestive) heart failure: Secondary | ICD-10-CM | POA: Diagnosis not present

## 2022-09-20 DIAGNOSIS — Z5986 Financial insecurity: Secondary | ICD-10-CM | POA: Insufficient documentation

## 2022-09-20 DIAGNOSIS — R0683 Snoring: Secondary | ICD-10-CM | POA: Insufficient documentation

## 2022-09-20 DIAGNOSIS — R0602 Shortness of breath: Secondary | ICD-10-CM | POA: Insufficient documentation

## 2022-09-20 DIAGNOSIS — Z79899 Other long term (current) drug therapy: Secondary | ICD-10-CM | POA: Diagnosis not present

## 2022-09-20 LAB — BASIC METABOLIC PANEL
Anion gap: 11 (ref 5–15)
BUN: 27 mg/dL — ABNORMAL HIGH (ref 8–23)
CO2: 30 mmol/L (ref 22–32)
Calcium: 9.6 mg/dL (ref 8.9–10.3)
Chloride: 95 mmol/L — ABNORMAL LOW (ref 98–111)
Creatinine, Ser: 1.16 mg/dL — ABNORMAL HIGH (ref 0.44–1.00)
GFR, Estimated: 52 mL/min — ABNORMAL LOW (ref >60)
Glucose, Bld: 141 mg/dL — ABNORMAL HIGH (ref 70–99)
Potassium: 4.2 mmol/L (ref 3.5–5.1)
Sodium: 136 mmol/L (ref 135–145)

## 2022-09-20 LAB — LIPID PANEL
Cholesterol: 99 mg/dL (ref 0–200)
HDL: 49 mg/dL (ref >40)
LDL Cholesterol: 28 mg/dL (ref 0–99)
Total CHOL/HDL Ratio: 2 RATIO
Triglycerides: 110 mg/dL (ref <150)
VLDL: 22 mg/dL (ref 0–40)

## 2022-09-20 LAB — ECHOCARDIOGRAM COMPLETE
Area-P 1/2: 2.95 cm2
Calc EF: 38.2 %
S' Lateral: 3.6 cm
Single Plane A2C EF: 38.1 %
Single Plane A4C EF: 38.9 %

## 2022-09-20 LAB — BRAIN NATRIURETIC PEPTIDE: B Natriuretic Peptide: 390.3 pg/mL — ABNORMAL HIGH (ref 0.0–100.0)

## 2022-09-20 MED ORDER — LOSARTAN POTASSIUM 25 MG PO TABS: 25.0000 mg | ORAL_TABLET | Freq: Every day | ORAL | 3 refills | Status: DC

## 2022-09-20 NOTE — Progress Notes (Signed)
Advanced Heart Failure Clinic Note    PCP: Aline Brochure, PA-C HF Cardiologist: Dr. Aundra Dubin  HPI: Andrea Mueller is a 67 y.o. female with hx of CAD s/p 4v CABG (11/2020), type 2 diabetes mellitus, anxiety/depression, CKD III, and ischemic cardiomyopathy.  She was diagnosed with coronary artery disease back in April 2022 after presenting to a hospital in [place] with fatigue and diaphoresis.  She underwent four-vessel CABG in [place] and appeared to initially recover quite well.  She returned to her home in [place] in May 2022, and was hospitalized 5/22 for decompensated heart failure and right lower leg cellulitis.  Her echo revealed normal LV systolic function.    In July 2022, she started to become more short of breath and felt pressure in her chest.  She presented to [facility] in [place] and was found to be in acute heart failure.  Her echo then (7/22) revealed new, significant LV systolic dysfunction with moderate mitral regurgitation.  She underwent a LHC which revealed severe native disease, and occluded vein graft to a diagonal, and otherwise patent bypass grafts.  She had elevated biventricular filling pressures with a normal cardiac index, although this was on a small amount of dobutamine.  She was diuresed and inotropes wean was attempted. She was reportedly hypotensive, and inotropes were restarted.  She was transferred to [facility], on dobutamine and milrinone, for further management and advanced therapies. Inotropes were gradually weaned off with stable Co-ox's. CVP was followed closely and stable on po torsemide. Started on Farxiga, spironolactone, and losartan with stability. Renal function remained stable. VAD workup initiated. D/c weight 172 lbs.  Post hospital follow up she was doing well from HF standpoint, participating in CR 3x/week. GDMT limited by low BP.  Admitted 10/22 with chest pain. Cardiac work up re-assuring and symptoms resolved. Echo showed EF up to 45-50%.  Cardiology discussed inpatient stress testing vs outpatient, and together elected to follow up outpatient.  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   Echo was done today and reviewed, EF 40-45%, mild RV dysfunction, normal IVC.   Today she returns for HF follow up with son.  She is short of breath walking to the mailbox and taking out the trash, but she is able to walk around stores generally and has no problems in her house.  No lightheadedness.  No chest pain.  No orthopnea/PND.  She falls asleep during the day and she snores at night.  Son thinks she has OSA.   ECG (personally reviewed): NSR, RBBB, inferior Qs, anterolateral Qs  Labs (10/22): LDL 26 Labs (12/22): K 4.4, creatinine 1.33 Labs (4/23): K 5.1, creatinine 1.54 Labs (6/23): LDL 32, K 5, creatinine 1.32 Labs (10/23): K 5.4, creatinine 1.36 Labs (11/23): K 4.7, creatinine 1.23  PMH: 1. CAD: CABG (4/22) with SVG-D, SVG-OM, LIMA-LAD, SVG RCA.  - Coronary angiography (7/22): Totally occluded mid LAD, LCx, and RCA; 80-90% stenosis small D1; patent LIMA-small, diseased LAD, patent SVG-RCA with small PDA and PLV, patent SVG-OM but 90% stenosis in small native vessel at anastomosis, occluded SVG-D.  Medical management.  2. Chronic systolic CHF: Ischemic cardiomyopathy.  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 - Echo (2/24): EF 40-45%, mild RV dysfunction, normal IVC.  3. CKD stage 3.  4. Type 2 diabetes.  5. Anxiety 6. Rheumatoid arthritis 7. 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   Current Outpatient Medications  Medication Sig Dispense Refill    acetaminophen (TYLENOL) 325 MG tablet Take 325 mg by mouth every 6 (six) hours as needed for moderate pain or headache.     aspirin 81 MG EC tablet Take 1 tablet (81 mg total) by mouth daily. Swallow whole. 30 tablet 11   atorvastatin (LIPITOR) 80 MG tablet Take 1 tablet by mouth once daily 30 tablet 11   busPIRone (BUSPAR) 5 MG tablet TAKE 1 TABLET BY MOUTH THREE TIMES DAILY 90 tablet 0   clopidogrel (PLAVIX) 75 MG tablet Take 1 tablet (75 mg total) by mouth daily. 90 tablet 3   dapagliflozin propanediol (FARXIGA) 10 MG TABS tablet Take 1 tablet (10 mg total) by mouth daily. 90 tablet 3   insulin NPH-regular Human (70-30) 100 UNIT/ML injection Inject 20 Units into the skin 2 (two) times daily with a meal. Patient takes 20 units in the morning and  only take 12 units at night.     LORazepam (ATIVAN) 0.5 MG tablet Take 0.25-0.5 mg by mouth daily as needed for anxiety.     melatonin 5 MG TABS Take 5 mg by mouth at bedtime.     metoprolol succinate (TOPROL XL) 25 MG 24 hr tablet Take 0.5 tablets (12.5 mg total) by mouth at bedtime. 45 tablet 3   Omega-3 Fatty Acids (FISH OIL) 1000 MG CAPS Take 1,000 mg by mouth daily.     senna-docusate (SENOKOT-S) 8.6-50 MG tablet Take 1 tablet by mouth daily.     sertraline (ZOLOFT) 50 MG tablet Take 1 tablet by mouth once daily 30 tablet 0   spironolactone (ALDACTONE) 25 MG tablet Take 25 mg by mouth daily.     torsemide (DEMADEX) 20 MG tablet Take 2 tablets (40 mg total) by mouth daily. 180 tablet 3   losartan (COZAAR) 25 MG tablet Take 1 tablet (25 mg total) by mouth at bedtime. 90 tablet 3   metolazone (ZAROXOLYN) 2.5 MG tablet Take 1 tablet (2.5 mg total) by mouth daily. (Patient taking differently: Take 2.5 mg by mouth daily. Only as directed by AHF clinic) 5 tablet 0   No current facility-administered medications for this encounter.   Allergies  Allergen Reactions   Statins     Other reaction(s): Other Myalgia   Codeine Nausea Only   Social History    Socioeconomic History   Marital status: Widowed    Spouse name: Not on file   Number of children: Not on file   Years of education: Not on file   Highest education level: Not on file  Occupational History   Not on file  Tobacco Use   Smoking status: Former    Packs/day: 1.00    Years: 15.00    Total pack years: 15.00    Types: Cigarettes    Start date: 08/16/1993    Quit date: 08/16/2000    Years since quitting: 22.1   Smokeless tobacco: Never  Substance and Sexual Activity   Alcohol use: Not Currently   Drug use: Not Currently   Sexual activity: Not Currently  Other Topics Concern   Not on file  Social History Narrative   Not on file   Social Determinants of Health   Financial Resource Strain: Medium Risk (04/22/2021)   Overall  Financial Resource Strain (CARDIA)    Difficulty of Paying Living Expenses: Somewhat hard  Food Insecurity: Food Insecurity Present (04/22/2021)   Hunger Vital Sign    Worried About Running Out of Food in the Last Year: Sometimes true    Ran Out of Food in the Last Year: Never true  Transportation Needs: No Transportation Needs (03/18/2021)   PRAPARE - Hydrologist (Medical): No    Lack of Transportation (Non-Medical): No  Physical Activity: Not on file  Stress: Not on file  Social Connections: Not on file  Intimate Partner Violence: Not on file   Family history: No premature CAD.   ROS: All systems reviewed and negative except as per HPI.   BP 110/60   Pulse (!) 52   Wt 85.7 kg (189 lb)   SpO2 97%   BMI 33.48 kg/m   Wt Readings from Last 3 Encounters:  09/20/22 85.7 kg (189 lb)  06/11/22 84.5 kg (186 lb 3.2 oz)  02/09/22 80.4 kg (177 lb 3.2 oz)   General: NAD Neck: No JVD, no thyromegaly or thyroid nodule.  Lungs: Clear to auscultation bilaterally with normal respiratory effort. CV: Nondisplaced PMI.  Heart regular S1/S2, no S3/S4, no murmur.  No peripheral edema.  No carotid bruit.  Normal pedal pulses.   Abdomen: Soft, nontender, no hepatosplenomegaly, no distention.  Skin: Intact without lesions or rashes.  Neurologic: Alert and oriented x 3.  Psych: Normal affect. Extremities: No clubbing or cyanosis.  HEENT: Normal.   ASSESSMENT & PLAN: 1. Chronic Systolic CHF: Ischemic cardiomyopathy.  Echo at [facility] in 7/22 with EF 25-30%, moderate MR.  She was admitted to [facility] in 7/22 with CHF, started on inotropes and diuresed.  RHC showed elevated filling pressures and preserved CO (but was on inotropes at the time).  Unable to wean off inotropes successfully, sent to [facility] for consideration of LVAD. Able to wean off dobutamine and milrinone successfully.  CPX showed mild HF limitation. Echo 10/22 showed EF up to 45-50%. Echo 12/22 with EF 50-55% with small apical aneurysm and no thrombus. Echo today showed EF 40-45%, mild RV dysfunction, normal IVC.  Stable NYHA II, she is not volume overloaded on exam.  GDMT has been limited by low BP.  - Continue torsemide 40 mg daily. BMET today. - Continue Toprol XL 12.5 mg qhs, HR too low to increase.  - Increase losartan to 25 mg qhs. I do not think that her BP would tolerate Entresto. BMET in 10 days.  - Continue Farxiga 10 mg daily. No GU symptoms. - Continue spironolactone 25 mg daily.  - EF is out of ICD range.  2. CAD: s/p CABG x 4 (4/22).  Cath in 7/22 with 3/4 grafts patent, occluded SVG-small D.  Native vessels, however, were small and diseased (diabetic coronaries). No ischemic chest pain.  - Continue ASA 81 + Plavix. - Continue atorvastatin, good lipids 6/23. 3. CKD stage 3: BMET today.  Follow up 4 months with APP.   Loralie Champagne, MD 09/20/22

## 2022-09-20 NOTE — Progress Notes (Signed)
Height:     Weight: BMI:  Today's Date:  STOP BANG RISK ASSESSMENT S (snore) Have you been told that you snore?     YES   T (tired) Are you often tired, fatigued, or sleepy during the day?   YES  O (obstruction) Do you stop breathing, choke, or gasp during sleep? NO   P (pressure) Do you have or are you being treated for high blood pressure? YES   B (BMI) Is your body index greater than 35 kg/m? NO   A (age) Are you 66 years old or older? YES   N (neck) Do you have a neck circumference greater than 16 inches?   NO   G (gender) Are you a female? NO   TOTAL STOP/BANG "YES" ANSWERS 4                                                                       For Office Use Only              Procedure Order Form    YES to 3+ Stop Bang questions OR two clinical symptoms - patient qualifies for WatchPAT (CPT 95800)      Clinical Notes: Will consult Sleep Specialist and refer for management of therapy due to patient increased risk of Sleep Apnea. Ordering a sleep study due to the following two clinical symptoms: Excessive daytime sleepiness G47.10 / Gastroesophageal reflux K21.9 / Nocturia R35.1 / Morning Headaches G44.221 / Difficulty concentrating R41.840 / Memory problems or poor judgment G31.84 / Personality changes or irritability R45.4 / Loud snoring R06.83 / Depression F32.9 / Unrefreshed by sleep G47.8 / Impotence N52.9 / History of high blood pressure R03.0 / Insomnia G47.00        

## 2022-09-20 NOTE — Patient Instructions (Signed)
INCREASE losartan to 25 mg daily.  Labs done today, your results will be available in MyChart, we will contact you for abnormal readings.  Repeat blood work in 10 days  Your provider has recommended that you have a home sleep study Therapist, art).  We have provided you with the equipment in our office today. Please go ahead and download the app. DO NOT OPEN OR TAMPER WITH THE BOX UNTIL WE ADVISE YOU TO DO SO. Once insurance has approved the test our office will call you with PIN number and approval to proceed with testing. Once you have completed the test you just dispose of the equipment, the information is automatically uploaded to [place] via blue-tooth technology. If your test is positive for sleep apnea and you need a home CPAP machine you will be contacted by Dr Theodosia Blender office [facility]) to set this up.  Your physician recommends that you schedule a follow-up appointment in: 4 months   If you have any questions or concerns before your next appointment please send [place] a message through [place] or call our office at [phone].    TO LEAVE A MESSAGE FOR THE NURSE SELECT OPTION 2, PLEASE LEAVE A MESSAGE INCLUDING: YOUR NAME DATE OF BIRTH CALL BACK NUMBER REASON FOR CALL**this is important as we prioritize the call backs  YOU WILL RECEIVE A CALL BACK THE SAME DAY AS LONG AS YOU CALL BEFORE 4:00 PM  At the [facility], you and your health needs are our priority. As part of our continuing mission to provide you with exceptional heart care, we have created designated Provider Care Teams. These Care Teams include your primary Cardiologist (physician) and Advanced Practice Providers (APPs- Physician Assistants and Nurse Practitioners) who all work together to provide you with the care you need, when you need it.   You may see any of the following providers on your designated Care Team at your next follow up: Dr Glori Bickers Dr Loralie Champagne Dr. Roxana Hires, NP Lyda Jester, [place] [facility] [place], [place] Forestine Na, NP Audry Riles, PharmD   Please be sure to bring in all your medications bottles to every appointment.    Thank you for choosing [facility]

## 2022-09-20 NOTE — Progress Notes (Signed)
  Echocardiogram 2D Echocardiogram has been performed.  Bobbye Charleston 09/20/2022, 2:47 PM

## 2022-12-02 IMAGING — DX DG CHEST 1V PORT
1 series · 1 of 1 positions shown · non-contrast
Comparison: None.

CLINICAL DATA: CHF.  Diabetes.  Nonsmoker.

EXAM:
PORTABLE CHEST 1 VIEW

[chest]
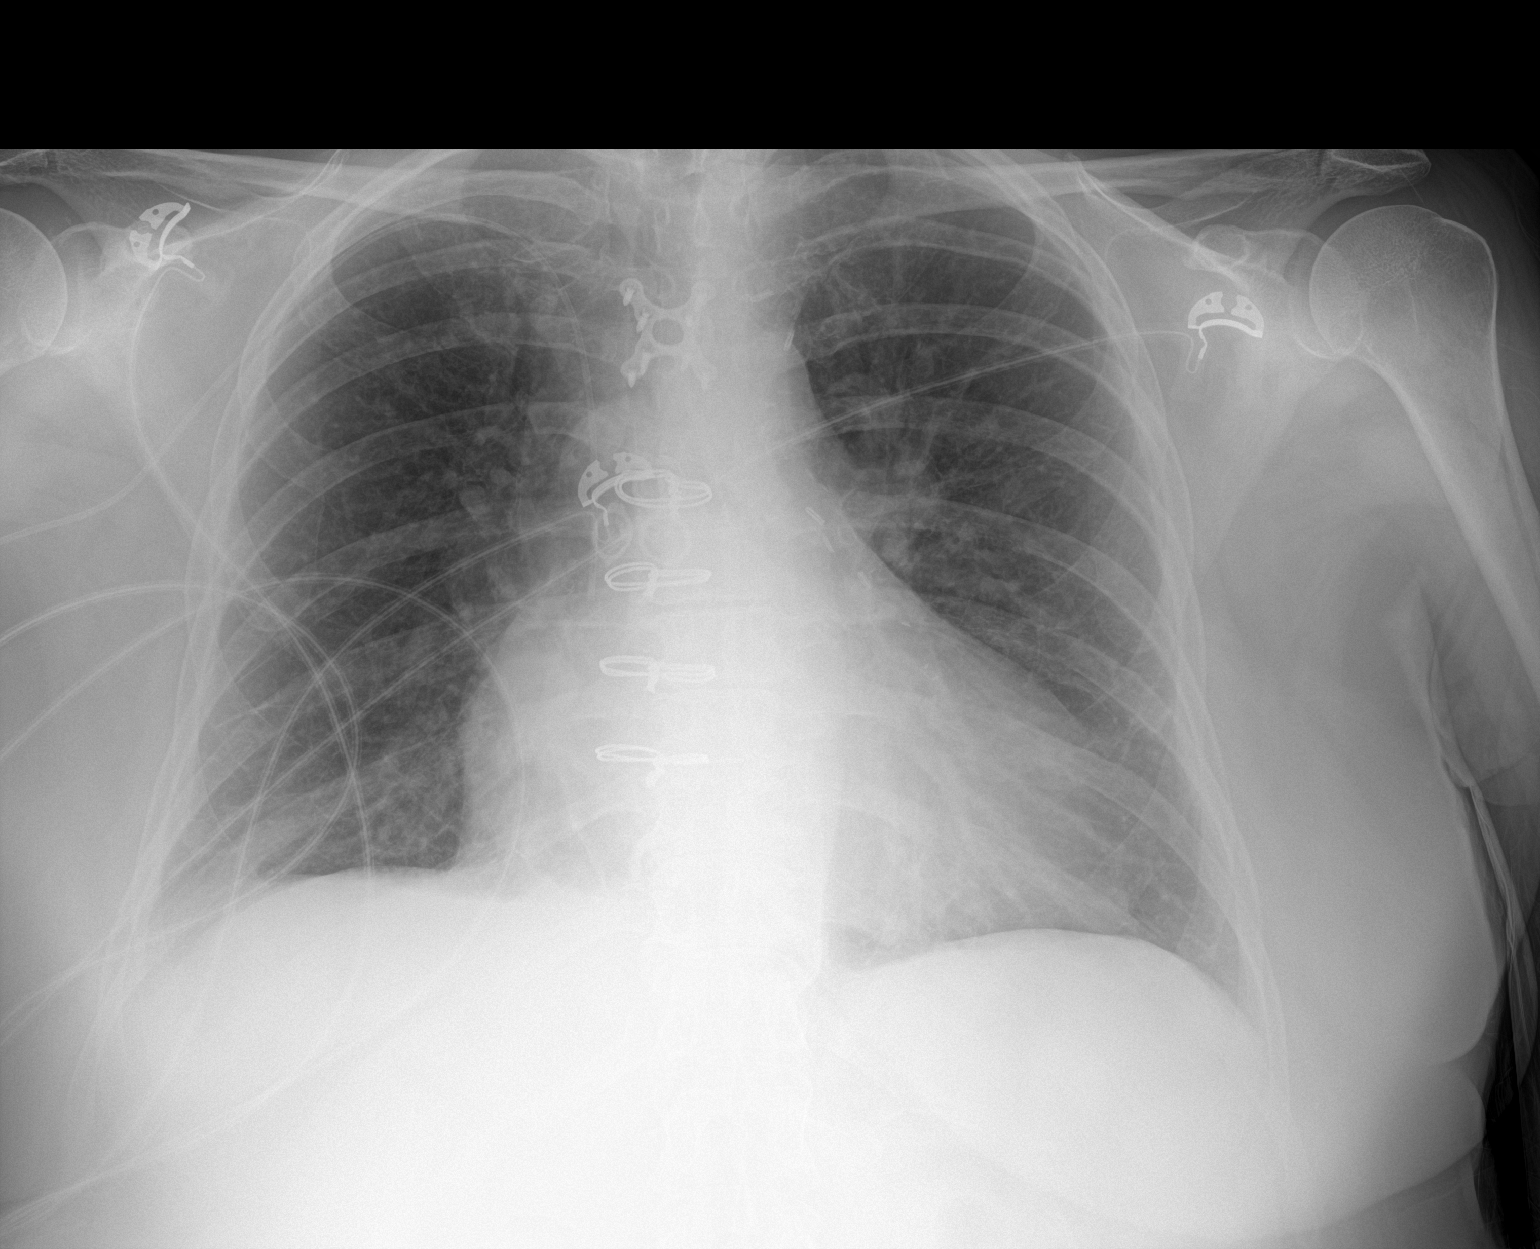

[1 of 1 positions shown; findings below may reference images not displayed]

FINDINGS: Right-sided PICC line terminates at low SVC. Prior median
sternotomy. Midline trachea. Mild cardiomegaly. No pleural effusion
or pneumothorax. No congestive failure. No lobar consolidation. Mild
subsegmental atelectasis or scarring suspected at the right lung
base laterally.
IMPRESSION: Cardiomegaly without congestive failure.

## 2023-01-20 ENCOUNTER — Encounter (HOSPITAL_COMMUNITY): Payer: Self-pay

## 2023-01-20 ENCOUNTER — Ambulatory Visit (HOSPITAL_COMMUNITY)
Admission: RE | Admit: 2023-01-20 | Discharge: 2023-01-20 | Disposition: A | Discharge status: Home / Self Care | Payer: Medicare Other | Source: Ambulatory Visit | Attending: Physician Assistant | Admitting: Physician Assistant

## 2023-01-20 VITALS — BP 130/58 | HR 56 | Wt 195.8 lb

## 2023-01-20 DIAGNOSIS — N1831 Chronic kidney disease, stage 3a: Secondary | ICD-10-CM | POA: Diagnosis present

## 2023-01-20 DIAGNOSIS — R61 Generalized hyperhidrosis: Secondary | ICD-10-CM | POA: Diagnosis not present

## 2023-01-20 DIAGNOSIS — I255 Ischemic cardiomyopathy: Secondary | ICD-10-CM | POA: Insufficient documentation

## 2023-01-20 DIAGNOSIS — Z7902 Long term (current) use of antithrombotics/antiplatelets: Secondary | ICD-10-CM | POA: Diagnosis not present

## 2023-01-20 DIAGNOSIS — I5022 Chronic systolic (congestive) heart failure: Secondary | ICD-10-CM | POA: Diagnosis not present

## 2023-01-20 DIAGNOSIS — E1122 Type 2 diabetes mellitus with diabetic chronic kidney disease: Secondary | ICD-10-CM | POA: Diagnosis not present

## 2023-01-20 DIAGNOSIS — I5042 Chronic combined systolic (congestive) and diastolic (congestive) heart failure: Secondary | ICD-10-CM | POA: Diagnosis not present

## 2023-01-20 DIAGNOSIS — I25810 Atherosclerosis of coronary artery bypass graft(s) without angina pectoris: Secondary | ICD-10-CM | POA: Insufficient documentation

## 2023-01-20 DIAGNOSIS — F419 Anxiety disorder, unspecified: Secondary | ICD-10-CM | POA: Insufficient documentation

## 2023-01-20 DIAGNOSIS — Z79899 Other long term (current) drug therapy: Secondary | ICD-10-CM | POA: Diagnosis not present

## 2023-01-20 DIAGNOSIS — F32A Depression, unspecified: Secondary | ICD-10-CM | POA: Diagnosis not present

## 2023-01-20 DIAGNOSIS — R5383 Other fatigue: Secondary | ICD-10-CM | POA: Insufficient documentation

## 2023-01-20 DIAGNOSIS — I2510 Atherosclerotic heart disease of native coronary artery without angina pectoris: Secondary | ICD-10-CM | POA: Diagnosis not present

## 2023-01-20 DIAGNOSIS — G4733 Obstructive sleep apnea (adult) (pediatric): Secondary | ICD-10-CM | POA: Insufficient documentation

## 2023-01-20 LAB — COMPREHENSIVE METABOLIC PANEL
ALT: 31 U/L (ref 0–44)
AST: 33 U/L (ref 15–41)
Albumin: 3.7 g/dL (ref 3.5–5.0)
Alkaline Phosphatase: 148 U/L — ABNORMAL HIGH (ref 38–126)
Anion gap: 9 (ref 5–15)
BUN: 50 mg/dL — ABNORMAL HIGH (ref 8–23)
CO2: 26 mmol/L (ref 22–32)
Calcium: 9 mg/dL (ref 8.9–10.3)
Chloride: 100 mmol/L (ref 98–111)
Creatinine, Ser: 2.05 mg/dL — ABNORMAL HIGH (ref 0.44–1.00)
GFR, Estimated: 26 mL/min — ABNORMAL LOW (ref >60)
Glucose, Bld: 98 mg/dL (ref 70–99)
Potassium: 4.8 mmol/L (ref 3.5–5.1)
Sodium: 135 mmol/L (ref 135–145)
Total Bilirubin: 0.6 mg/dL (ref 0.3–1.2)
Total Protein: 7.2 g/dL (ref 6.5–8.1)

## 2023-01-20 LAB — BRAIN NATRIURETIC PEPTIDE: B Natriuretic Peptide: 503.9 pg/mL — ABNORMAL HIGH (ref 0.0–100.0)

## 2023-01-20 NOTE — Patient Instructions (Addendum)
RedsClip done today.  Labs done today. We will contact you only if your labs are abnormal.  No medication changes were made. Please continue all current medications as prescribed.  Your physician recommends that you schedule a follow-up appointment in: 3 months  If you have any questions or concerns before your next appointment please send [place] a message through [place] or call our office at [phone].    TO LEAVE A MESSAGE FOR THE NURSE SELECT OPTION 2, PLEASE LEAVE A MESSAGE INCLUDING: YOUR NAME DATE OF BIRTH CALL BACK NUMBER REASON FOR CALL**this is important as we prioritize the call backs  YOU WILL RECEIVE A CALL BACK THE SAME DAY AS LONG AS YOU CALL BEFORE 4:00 PM   Do the following things EVERYDAY: Weigh yourself in the morning before breakfast. Write it down and keep it in a log. Take your medicines as prescribed Eat low salt foods--Limit salt (sodium) to 2000 mg per day.  Stay as active as you can everyday Limit all fluids for the day to less than 2 liters   At the [facility], you and your health needs are our priority. As part of our continuing mission to provide you with exceptional heart care, we have created designated Provider Care Teams. These Care Teams include your primary Cardiologist (physician) and Advanced Practice Providers (APPs- Physician Assistants and Nurse Practitioners) who all work together to provide you with the care you need, when you need it.   You may see any of the following providers on your designated Care Team at your next follow up: Dr Arvilla Meres Dr Marca Ancona Dr. Marcos Eke, NP Robbie Lis, [place] [facility] [place], [place] Brynda Peon, NP Karle Plumber, PharmD   Please be sure to bring in all your medications bottles to every appointment.    Thank you for choosing [facility]

## 2023-01-20 NOTE — Progress Notes (Addendum)
Advanced Heart Failure Clinic Note    PCP: Jeanice Lim, PA-C HF Cardiologist: Dr. Shirlee Latch  HPI: Andrea Mueller is a 67 y.o. female with hx of CAD s/p 4v CABG (11/2020), type 2 diabetes mellitus, anxiety/depression, CKD IIIa, and ischemic cardiomyopathy.  She was diagnosed with coronary artery disease back in April 2022 after presenting to a hospital in [place] with fatigue and diaphoresis.  She underwent four-vessel CABG in [place] and appeared to initially recover quite well.  She returned to her home in [place] in May 2022, and was hospitalized 5/22 for decompensated heart failure and right lower leg cellulitis.  Her echo revealed normal LV systolic function.    Admit to [facility] in [facility] 07/22 with acute heart failure.  Echo revealed newly reduced LV systolic function with moderate mitral regurgitation. LHC demonstrated severe native disease, and occluded vein graft to a diagonal, and otherwise patent bypass grafts.  She had elevated biventricular filling pressures with a normal cardiac index, although this was on a small amount of dobutamine.  She was diuresed and inotropes wean was attempted. She was reportedly hypotensive, and inotropes were restarted.  She was transferred to [facility], on dobutamine and milrinone, for further management and advanced therapies. Inotropes were gradually weaned off.   CPX: 09/22: Mild HF limitation. Obesity playing a role. Mild chronotropic incompetence.  Echo 10/22: EF up to 45-50%.  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   Echo 02/24: EF 40-45%, mild RV dysfunction, normal IVC.   Last seen for f/u 09/20/22. Losartan increased. Volume stable.   Has osteomyelitis involving left 2nd toe. She has been treated with abx for MRSA and followed with Podiatry, Wound Clinic and recently ID. ABI 01/06/23: R ABI 1.03 with toe pressure of 32 mmHg, left ABI 1.11 with toe pressure  43 mmhg. She has been scheduled for partial vs complete 2nd digit amputation later this month. Plans to see Vascular next week to see if she needs intervention to promote wound healing.  She is here today for f/u. Unfortunately, her daughter passed away at the end of 11-07-22. She was very stressed around that time and had some episodes of chest discomfort and sensation of "everything closing in". Denies any predictable chest pain with exertion. She does have shortness of breath with exertion which may be a little bit worse. Has gained 18 lb over the last year, 6 lb since her last visit. Reports that she tends to eat more when stressed. Has not been as active the last few months d/t left foot infection. No orthopnea or PND. Has b/l LE edema that tends to be better in am and worse towards end of the day. Tolerating all medications. SBP tends to average in 100s.   Has not completed home sleep study.  Labs (10/22): LDL 26 Labs (12/22): K 4.4, creatinine 1.33 Labs (4/23): K 5.1, creatinine 1.54 Labs (6/23): LDL 32, K 5, creatinine 1.32 Labs (10/23): K 5.4, creatinine 1.36 Labs (11/23): K 4.7, creatinine 1.23 Labs (02/24): K 4.2, creatinine 1.16, BNP 390, LDL 28,  Labs (04/24): A1c 8.8%  PMH: 1. CAD: CABG (4/22) with SVG-D, SVG-OM, LIMA-LAD, SVG RCA.  - Coronary angiography (7/22): Totally occluded mid LAD, LCx, and RCA; 80-90% stenosis small D1; patent LIMA-small, diseased LAD, patent SVG-RCA with small PDA and PLV, patent SVG-OM but 90% stenosis in small native vessel at anastomosis, occluded SVG-D.  Medical management.  2. Chronic systolic CHF: Ischemic cardiomyopathy.  -  RHC (7/22): mean RA 12, PA 55/19 mean 33, mean PCWP 25, CI 3.12 (on inotrope) - Echo (7/22): EF 25-30%, moderate MR - CPX (9/22): Mild HF limitation, additional limitations related to body habitus, mild chronotropic incompetence (not on beta blocker) - Echo (12/22): EF 50-55% with apical aneurysm, no LV thrombus, mildly decreased  RV systolic function, IVC normal. - Echo (2/24): EF 40-45%, mild RV dysfunction, normal IVC.  3. CKD stage 3.  4. Type 2 diabetes.  5. Anxiety 6. Rheumatoid arthritis 7. 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  8. Left 2nd toe osteomyelitis  Current Outpatient Medications  Medication Sig Dispense Refill   acetaminophen (TYLENOL) 325 MG tablet Take 325 mg by mouth every 6 (six) hours as needed for moderate pain or headache.     aspirin 81 MG EC tablet Take 1 tablet (81 mg total) by mouth daily. Swallow whole. 30 tablet 11   atorvastatin (LIPITOR) 80 MG tablet Take 1 tablet by mouth once daily 30 tablet 11   busPIRone (BUSPAR) 5 MG tablet TAKE 1 TABLET BY MOUTH THREE TIMES DAILY 90 tablet 0   clopidogrel (PLAVIX) 75 MG tablet Take 1 tablet (75 mg total) by mouth daily. 90 tablet 3   dapagliflozin propanediol (FARXIGA) 10 MG TABS tablet Take 1 tablet (10 mg total) by mouth daily. 90 tablet 3   doxycycline (VIBRA-TABS) 100 MG tablet Take 100 mg by mouth 2 (two) times daily.     insulin NPH-regular Human (70-30) 100 UNIT/ML injection Inject 25-28 Units into the skin 2 (two) times daily with a meal. Patient takes 25 units in the morning and  only take 28 units at night.     LORazepam (ATIVAN) 0.5 MG tablet Take 0.25-0.5 mg by mouth daily as needed for anxiety.     losartan (COZAAR) 25 MG tablet Take 1 tablet (25 mg total) by mouth at bedtime. 90 tablet 3   melatonin 5 MG TABS Take 5 mg by mouth at bedtime.     metolazone (ZAROXOLYN) 2.5 MG tablet Take 1 tablet (2.5 mg total) by mouth daily. (Patient taking differently: Take 2.5 mg by mouth daily. Only as directed by AHF clinic) 5 tablet 0   metoprolol succinate (TOPROL XL) 25 MG 24 hr tablet Take 0.5 tablets (12.5 mg total) by mouth at bedtime. 45 tablet 3   Omega-3 Fatty Acids (FISH OIL) 1000 MG CAPS Take 1,000 mg by mouth daily.     senna-docusate (SENOKOT-S) 8.6-50 MG tablet Take 1 tablet by mouth daily.     sertraline (ZOLOFT) 50 MG tablet Take 1 tablet  by mouth once daily 30 tablet 0   spironolactone (ALDACTONE) 25 MG tablet Take 25 mg by mouth daily.     torsemide (DEMADEX) 20 MG tablet Take 2 tablets (40 mg total) by mouth daily. 180 tablet 3   No current facility-administered medications for this encounter.   Allergies  Allergen Reactions   Statins     Other reaction(s): Other Myalgia   Codeine Nausea Only   Social History   Socioeconomic History   Marital status: Widowed    Spouse name: Not on file   Number of children: Not on file   Years of education: Not on file   Highest education level: Not on file  Occupational History   Not on file  Tobacco Use   Smoking status: Former    Packs/day: 1.00    Years: 15.00    Additional pack years: 0.00    Total pack years: 15.00  Types: Cigarettes    Start date: 08/16/1993    Quit date: 08/16/2000    Years since quitting: 22.4   Smokeless tobacco: Never  Substance and Sexual Activity   Alcohol use: Not Currently   Drug use: Not Currently   Sexual activity: Not Currently  Other Topics Concern   Not on file  Social History Narrative   Not on file   Social Determinants of Health   Financial Resource Strain: Medium Risk (04/22/2021)   Overall Financial Resource Strain (CARDIA)    Difficulty of Paying Living Expenses: Somewhat hard  Food Insecurity: Food Insecurity Present (04/22/2021)   Hunger Vital Sign    Worried About Running Out of Food in the Last Year: Sometimes true    Ran Out of Food in the Last Year: Never true  Transportation Needs: No Transportation Needs (03/18/2021)   PRAPARE - Administrator, Civil Service (Medical): No    Lack of Transportation (Non-Medical): No  Physical Activity: Not on file  Stress: Not on file  Social Connections: Not on file  Intimate Partner Violence: Not on file   Family history: No premature CAD.   ROS: All systems reviewed and negative except as per HPI.   BP (!) 130/58   Pulse (!) 56   Wt 88.8 kg (195 lb 12.8 oz)    SpO2 95%   BMI 34.68 kg/m   Wt Readings from Last 3 Encounters:  01/20/23 88.8 kg (195 lb 12.8 oz)  09/20/22 85.7 kg (189 lb)  06/11/22 84.5 kg (186 lb 3.2 oz)   General:  Well appearing.  HEENT: normal Neck: supple. no JVD. Carotids 2+ bilat; no bruits.  Cor: PMI nondisplaced. Regular rate & rhythm. No rubs, gallops or murmurs. Lungs: clear Abdomen: obese, soft, nontender, nondistended.  Extremities: no cyanosis, clubbing, rash, 1+ b/l LE edema, dressing over left 2nd toe, decreased DP and PT pulses  Neuro: alert & orientedx3   ReDS 29%  ASSESSMENT & PLAN: 1. Chronic Systolic CHF: Ischemic cardiomyopathy.  Echo at [facility] in 7/22 with EF 25-30%, moderate MR.  She was admitted to [facility] in 7/22 with CHF, started on inotropes and diuresed.  RHC showed elevated filling pressures and preserved CO (but was on inotropes at the time).  Unable to wean off inotropes successfully, sent to [facility] for consideration of LVAD. Able to wean off dobutamine and milrinone successfully.  CPX showed mild HF limitation. Echo 10/22 showed EF up to 45-50%. Echo 12/22: EF 50-55% with small apical aneurysm and no thrombus. Echo 02/24: EF 40-45%, mild RV dysfunction, normal IVC.   - NYHA II/early III. Does not appear volume overloaded on exam or by ReDS (29%). Suspect dyspnea multifactorial (ie recent weight gain, deconditioning, CHF). Plans to join gym after her toe amputation. If symptoms continue to progress, consider repeat echo next visit. - Continue torsemide 40 mg daily.  - Continue Toprol XL 12.5 mg qhs, HR too low to increase.  - Continue losartan 25 mg qhs. SBP okay in clinic today but consistently 100s systolic at home. Do not think she would tolerate Entresto. - Continue Farxiga 10 mg daily. No GU symptoms. - Continue spironolactone 25 mg daily.  - EF is out of ICD range.  - Labs today 2. CAD: s/p CABG x 4 (4/22).  Cath in 7/22 with 3/4 grafts patent, occluded SVG-small D.  Native vessels, however,  were small and diseased (diabetic coronaries). No ischemic chest pain.  - Continue ASA 81 + Plavix. - Continue atorvastatin,  good lipids 02/24. 3. CKD stage 3a: BMET today. 4. OSA: Needs to complete sleep study. She plans to do this in next couple of weeks. 5. DM: On insulin -A1c 8.8 04/24 -Continue Farxiga -Needs better diabetes control for wound healing 6. Left second toe osteomyelitis: On doxycycline. Planning to undergo left 2nd toe partial or complete amputation in [place] later this month.   Follow up 3 months   Bristyl Mclees N, PA-C 01/20/23

## 2023-01-20 NOTE — Progress Notes (Signed)
ReDS Vest / Clip - 01/20/23 1100       ReDS Vest / Clip   Station Marker A    Ruler Value 31    ReDS Value Range Low volume    ReDS Actual Value 29

## 2023-02-20 ENCOUNTER — Other Ambulatory Visit (HOSPITAL_COMMUNITY): Payer: Self-pay | Admitting: Cardiology

## 2023-02-20 IMAGING — CR DG CHEST 2V
2 series · 2 of 2 positions shown · non-contrast
Comparison: 03/14/2021

CLINICAL DATA: Chest pain

EXAM:
CHEST - 2 VIEW

[chest pa]
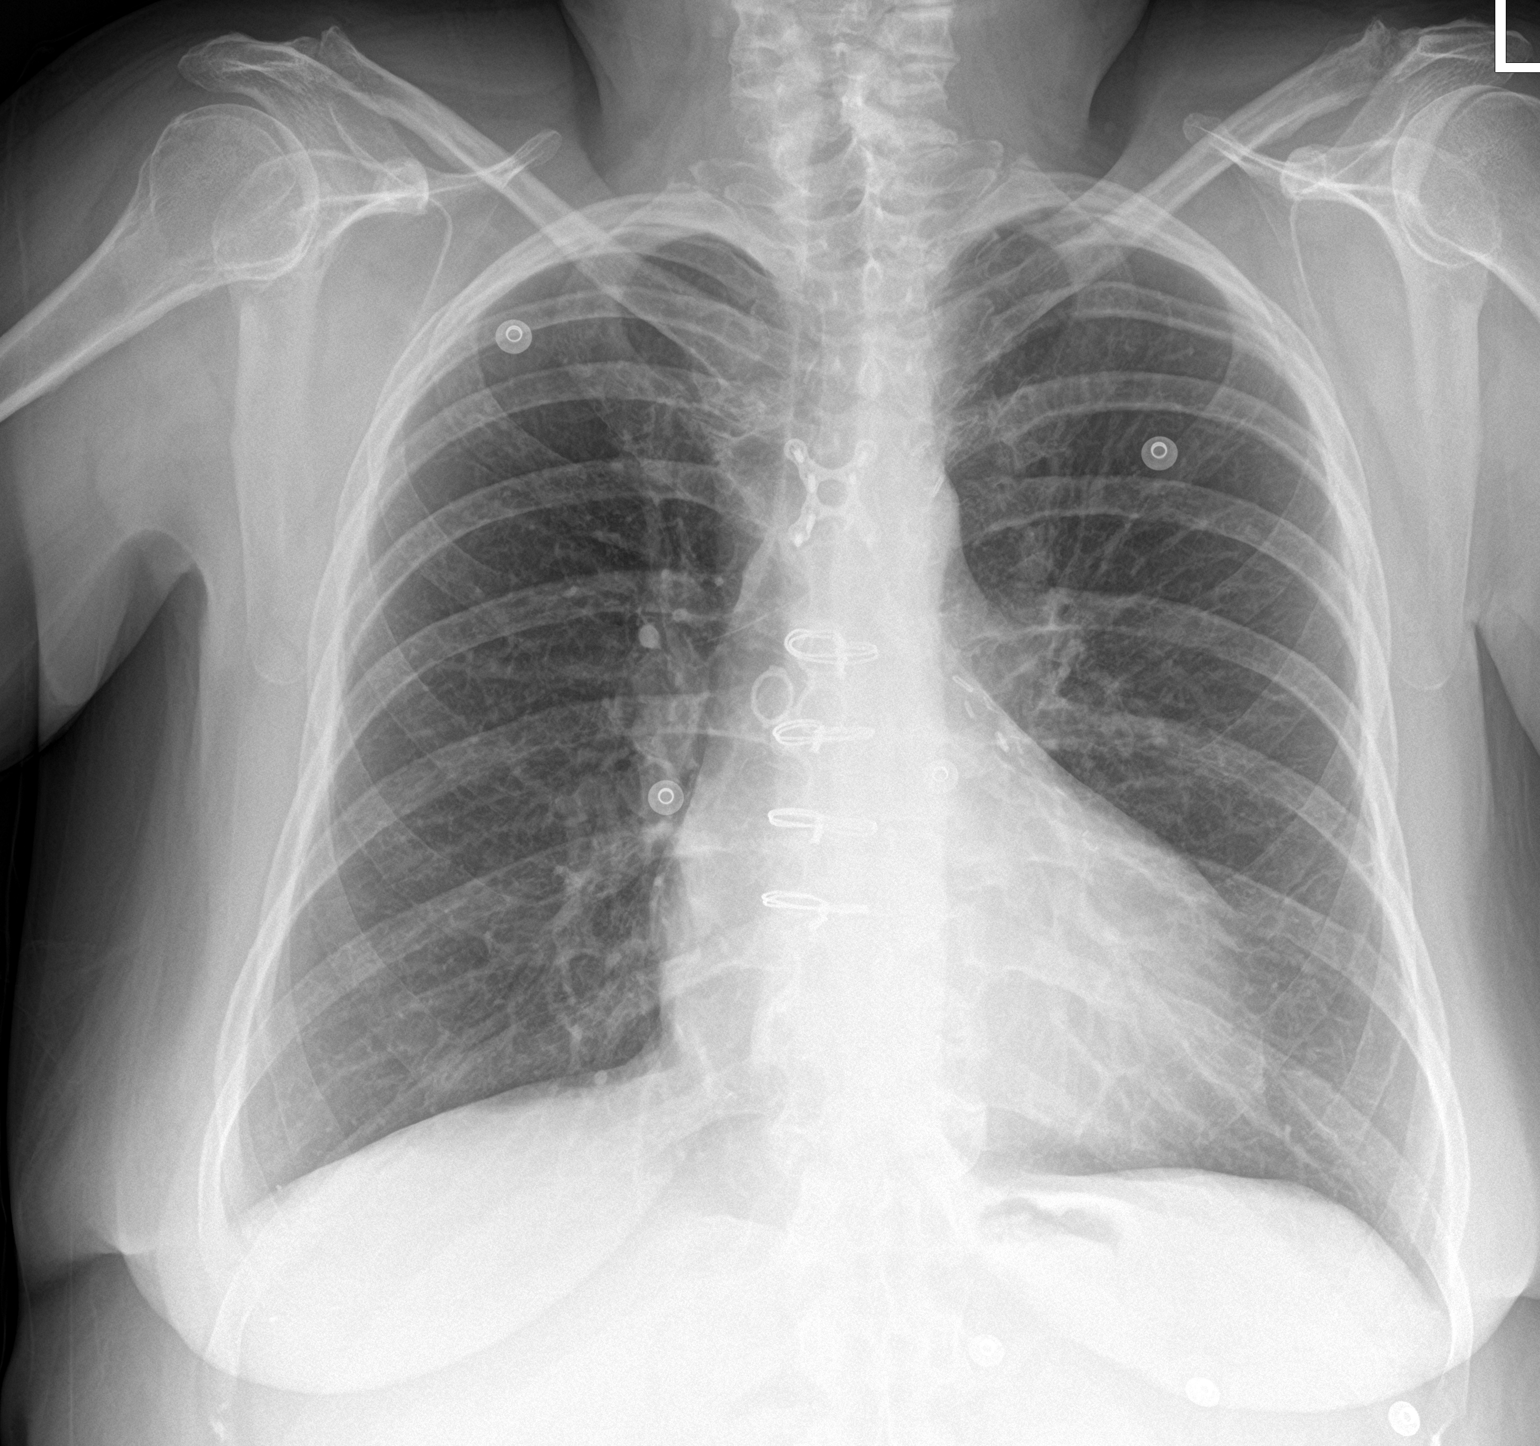

[chest lat]
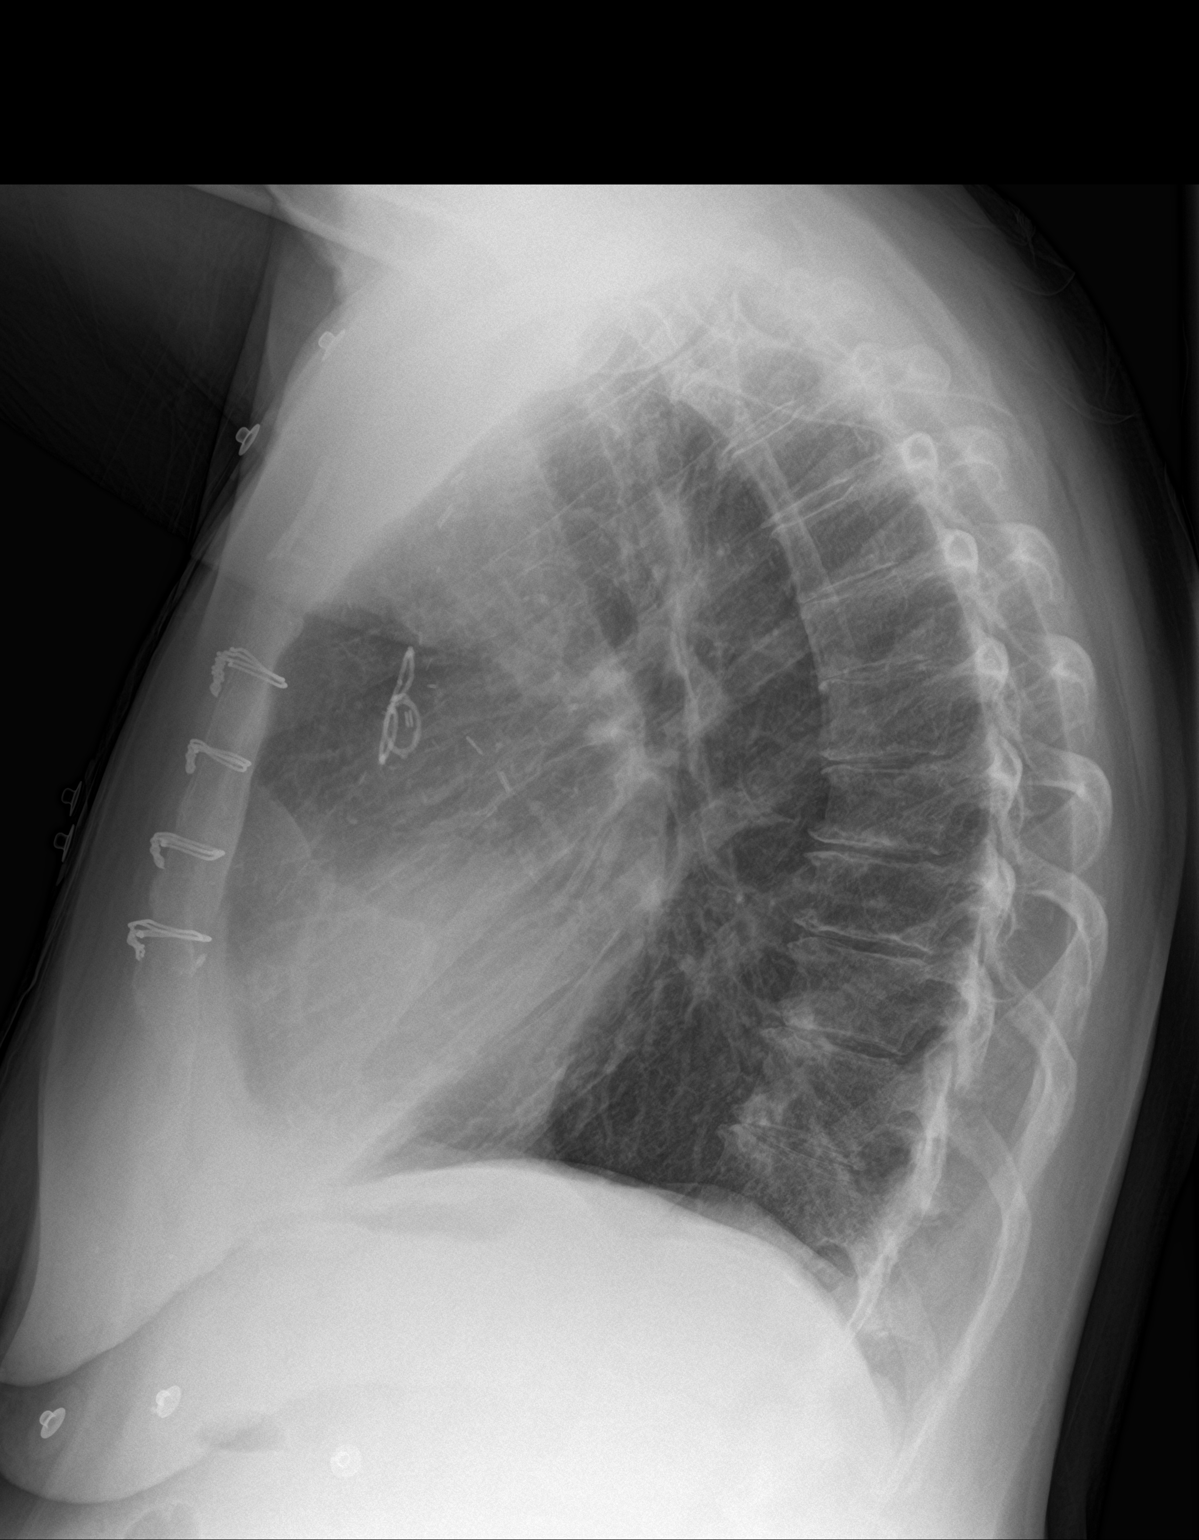

[2 of 2 positions shown; findings below may reference images not displayed]

FINDINGS: Post sternotomy changes. No focal opacity or significant pleural
effusion. Cardiomediastinal silhouette within normal limits. No
pneumothorax.
IMPRESSION: No active cardiopulmonary disease.

## 2023-04-14 ENCOUNTER — Encounter (HOSPITAL_COMMUNITY): Payer: PRIVATE HEALTH INSURANCE | Admitting: Cardiology

## 2023-04-20 ENCOUNTER — Other Ambulatory Visit (HOSPITAL_COMMUNITY): Payer: Self-pay

## 2023-04-20 ENCOUNTER — Ambulatory Visit (HOSPITAL_COMMUNITY)
Admission: RE | Admit: 2023-04-20 | Discharge: 2023-04-20 | Disposition: A | Discharge status: Home / Self Care | Payer: Medicare Other | Source: Ambulatory Visit | Attending: Cardiology | Admitting: Cardiology

## 2023-04-20 ENCOUNTER — Encounter (HOSPITAL_COMMUNITY): Payer: Self-pay | Admitting: Cardiology

## 2023-04-20 ENCOUNTER — Telehealth (HOSPITAL_COMMUNITY): Payer: Self-pay | Admitting: Pharmacy Technician

## 2023-04-20 VITALS — BP 110/70 | HR 51 | Wt 199.0 lb

## 2023-04-20 DIAGNOSIS — I5022 Chronic systolic (congestive) heart failure: Secondary | ICD-10-CM | POA: Insufficient documentation

## 2023-04-20 DIAGNOSIS — E1151 Type 2 diabetes mellitus with diabetic peripheral angiopathy without gangrene: Secondary | ICD-10-CM | POA: Insufficient documentation

## 2023-04-20 DIAGNOSIS — Z7984 Long term (current) use of oral hypoglycemic drugs: Secondary | ICD-10-CM | POA: Diagnosis not present

## 2023-04-20 DIAGNOSIS — I2510 Atherosclerotic heart disease of native coronary artery without angina pectoris: Secondary | ICD-10-CM | POA: Diagnosis present

## 2023-04-20 DIAGNOSIS — I5042 Chronic combined systolic (congestive) and diastolic (congestive) heart failure: Secondary | ICD-10-CM

## 2023-04-20 DIAGNOSIS — Z951 Presence of aortocoronary bypass graft: Secondary | ICD-10-CM | POA: Insufficient documentation

## 2023-04-20 DIAGNOSIS — N1831 Chronic kidney disease, stage 3a: Secondary | ICD-10-CM | POA: Diagnosis not present

## 2023-04-20 DIAGNOSIS — I255 Ischemic cardiomyopathy: Secondary | ICD-10-CM | POA: Insufficient documentation

## 2023-04-20 DIAGNOSIS — G4733 Obstructive sleep apnea (adult) (pediatric): Secondary | ICD-10-CM | POA: Insufficient documentation

## 2023-04-20 DIAGNOSIS — E1122 Type 2 diabetes mellitus with diabetic chronic kidney disease: Secondary | ICD-10-CM | POA: Insufficient documentation

## 2023-04-20 LAB — BASIC METABOLIC PANEL
Anion gap: 11 (ref 5–15)
BUN: 33 mg/dL — ABNORMAL HIGH (ref 8–23)
CO2: 29 mmol/L (ref 22–32)
Calcium: 9.5 mg/dL (ref 8.9–10.3)
Chloride: 97 mmol/L — ABNORMAL LOW (ref 98–111)
Creatinine, Ser: 1.33 mg/dL — ABNORMAL HIGH (ref 0.44–1.00)
GFR, Estimated: 44 mL/min — ABNORMAL LOW (ref >60)
Glucose, Bld: 234 mg/dL — ABNORMAL HIGH (ref 70–99)
Potassium: 4.9 mmol/L (ref 3.5–5.1)
Sodium: 137 mmol/L (ref 135–145)

## 2023-04-20 LAB — BRAIN NATRIURETIC PEPTIDE: B Natriuretic Peptide: 329.3 pg/mL — ABNORMAL HIGH (ref 0.0–100.0)

## 2023-04-20 MED ORDER — ENTRESTO 24-26 MG PO TABS: 1.0000 | ORAL_TABLET | Freq: Two times a day (BID) | ORAL | 3 refills | Status: DC

## 2023-04-20 MED ORDER — TORSEMIDE 20 MG PO TABS: 20.0000 mg | ORAL_TABLET | Freq: Every day | ORAL | 3 refills | Status: DC

## 2023-04-20 MED ORDER — XARELTO 2.5 MG PO TABS: 2.5000 mg | ORAL_TABLET | Freq: Two times a day (BID) | ORAL | 3 refills | Status: DC

## 2023-04-20 NOTE — Telephone Encounter (Signed)
Advanced Heart Failure Patient Advocate Encounter  The patient was approved for a [employer] grant that will help cover the cost of Entresto, Farxiga, Metoprolol and Spironolactone. Total amount awarded, $10,000. Eligibility, 03/21/23 - 03/19/24.  ID [id number]  BIN [id number]  PCN [id number]  Group [id number]  Patient was given a copy while in clinic.  Archer Asa, CPhT

## 2023-04-20 NOTE — Progress Notes (Signed)
Advanced Heart Failure Clinic Note    PCP: Jeanice Lim, PA-C HF Cardiologist: Dr. Shirlee Latch  HPI: Andrea Mueller is a 67 y.o. female with hx of CAD s/p 4v CABG (11/2020), type 2 diabetes mellitus, anxiety/depression, CKD IIIa, and ischemic cardiomyopathy.  She was diagnosed with coronary artery disease back in April 2022 after presenting to a hospital in [place] with fatigue and diaphoresis.  She underwent four-vessel CABG in [place] and appeared to initially recover quite well.  She returned to her home in [place] in May 2022, and was hospitalized 5/22 for decompensated heart failure and right lower leg cellulitis.  Her echo revealed normal LV systolic function.    Admit to [facility] in [facility] 07/22 with acute heart failure.  Echo revealed newly reduced LV systolic function with moderate mitral regurgitation. LHC demonstrated severe native disease, and occluded vein graft to a diagonal, and otherwise patent bypass grafts.  She had elevated biventricular filling pressures with a normal cardiac index, although this was on a small amount of dobutamine.  She was diuresed and inotropes wean was attempted. She was reportedly hypotensive, and inotropes were restarted.  She was transferred to [facility], on dobutamine and milrinone, for further management and advanced therapies. Inotropes were gradually weaned off.   CPX: 09/22: Mild HF limitation. Obesity playing a role. Mild chronotropic incompetence.  Echo 10/22: EF up to 45-50%.  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   Echo 02/24: EF 40-45%, mild RV dysfunction, normal IVC.    Had osteomyelitis involving left 2nd toe, now s/p amputation.  Peripheral angiography in 6/24 showed severe tibioperoneal disease on left without interventional option.   She is here today for f/u of CHF.  Weight up 4 lbs.  She has not had to take metolazone.  She has been less active  because of her toe infection but no dyspnea walking on flat ground or walking around grocery store.  No chest pain.  No orthopnea/PND.  No lightheadedness or palpitations.  No claudication.   Labs (10/22): LDL 26 Labs (12/22): K 4.4, creatinine 1.33 Labs (4/23): K 5.1, creatinine 1.54 Labs (6/23): LDL 32, K 5, creatinine 1.32 Labs (10/23): K 5.4, creatinine 1.36 Labs (11/23): K 4.7, creatinine 1.23 Labs (02/24): K 4.2, creatinine 1.16, BNP 390, LDL 28,  Labs (04/24): A1c 8.8% Labs (6/24): LDL 33 Labs (8/24): K 5, creatinine 1.36  PMH: 1. CAD: CABG (4/22) with SVG-D, SVG-OM, LIMA-LAD, SVG RCA.  - Coronary angiography (7/22): Totally occluded mid LAD, LCx, and RCA; 80-90% stenosis small D1; patent LIMA-small, diseased LAD, patent SVG-RCA with small PDA and PLV, patent SVG-OM but 90% stenosis in small native vessel at anastomosis, occluded SVG-D.  Medical management.  2. Chronic systolic CHF: Ischemic cardiomyopathy.  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 - Echo (2/24): EF 40-45%, mild RV dysfunction, normal IVC.  3. CKD stage 3.  4. Type 2 diabetes.  5. Anxiety 6. Rheumatoid arthritis 7. 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  8. 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  9. PAD: Peripheral angiography in 6/24 showed severe tibioperoneal disease on left.   Current Outpatient Medications  Medication Sig Dispense Refill  acetaminophen (TYLENOL) 325 MG tablet Take 325 mg by mouth every 6 (six) hours as needed for moderate pain or headache.     aspirin 81 MG EC tablet Take 1 tablet (81 mg total) by mouth daily. Swallow whole. 30 tablet 11   atorvastatin (LIPITOR) 80 MG  tablet Take 1 tablet by mouth once daily 30 tablet 11   busPIRone (BUSPAR) 5 MG tablet TAKE 1 TABLET BY MOUTH THREE TIMES DAILY 90 tablet 0   dapagliflozin propanediol (FARXIGA) 10 MG TABS tablet Take 1 tablet (10 mg total) by mouth daily. 90 tablet 3   insulin NPH-regular Human (70-30) 100 UNIT/ML injection Inject 25-28 Units into the skin 2 (two) times daily with a meal. Patient takes 25 units in the morning and  only take 28 units at night.     LORazepam (ATIVAN) 0.5 MG tablet Take 0.25-0.5 mg by mouth daily as needed for anxiety.     melatonin 5 MG TABS Take 5 mg by mouth at bedtime.     metolazone (ZAROXOLYN) 2.5 MG tablet Take 2.5 mg by mouth once a week. As directed by AHF     metoprolol succinate (TOPROL XL) 25 MG 24 hr tablet Take 0.5 tablets (12.5 mg total) by mouth at bedtime. 45 tablet 3   Omega-3 Fatty Acids (FISH OIL) 1000 MG CAPS Take 1,000 mg by mouth daily.     rivaroxaban (XARELTO) 2.5 MG TABS tablet Take 1 tablet (2.5 mg total) by mouth 2 (two) times daily. 60 tablet 3   sacubitril-valsartan (ENTRESTO) 24-26 MG Take 1 tablet by mouth 2 (two) times daily. 60 tablet 3   senna-docusate (SENOKOT-S) 8.6-50 MG tablet Take 1 tablet by mouth daily.     sertraline (ZOLOFT) 50 MG tablet Take 1 tablet by mouth once daily 30 tablet 0   spironolactone (ALDACTONE) 25 MG tablet Take 25 mg by mouth daily.     torsemide (DEMADEX) 20 MG tablet Take 1 tablet (20 mg total) by mouth daily. 30 tablet 3   No current facility-administered medications for this encounter.   Allergies  Allergen Reactions   Statins     Other reaction(s): Other Myalgia   Codeine Nausea Only   Social History   Socioeconomic History   Marital status: Widowed    Spouse name: Not on file   Number of children: Not on file   Years of education: Not on file   Highest education level: Not on file  Occupational History   Not on file  Tobacco Use   Smoking status: Former    Current packs/day: 0.00    Average  packs/day: 1 pack/day for 15.0 years (15.0 ttl pk-yrs)    Types: Cigarettes    Start date: 08/16/1993    Quit date: 08/16/2000    Years since quitting: 22.6   Smokeless tobacco: Never  Substance and Sexual Activity   Alcohol use: Not Currently   Drug use: Not Currently   Sexual activity: Not Currently  Other Topics Concern   Not on file  Social History Narrative   Not on file   Social Determinants of Health   Financial Resource Strain: Medium Risk (10/10/2022)   Received from [facility], [facility]   Overall Financial Resource Strain (CARDIA)    Difficulty of Paying Living Expenses: Somewhat hard  Food Insecurity: Patient Declined (10/10/2022)   Received from [facility], [facility]   Hunger Vital Sign    Worried About Running Out of Food in the Last Year: Patient declined  Ran Out of Food in the Last Year: Patient declined  Recent Concern: Food Insecurity - Food Insecurity Present (09/05/2022)   Received from [facility]   Hunger Vital Sign    Worried About Running Out of Food in the Last Year: Sometimes true    Ran Out of Food in the Last Year: Patient refused  Transportation Needs: No Transportation Needs (10/10/2022)   Received from [facility], [facility]   PRAPARE - Transportation    Lack of Transportation (Medical): No    Lack of Transportation (Non-Medical): No  Physical Activity: Insufficiently Active (10/10/2022)   Received from [facility], [facility]   Exercise Vital Sign    Days of Exercise per Week: 3 days    Minutes of Exercise per Session: 20 min  Stress: No Stress Concern Present (02/02/2023)   Received from [facility], [facility] of Occupational [facility] - Occupational Stress Questionnaire    Feeling of Stress : Not at all  Recent Concern: Stress - Stress Concern Present (12/24/2022)   Received from [facility] of Occupational Health - Occupational Stress Questionnaire    Feeling of Stress :  Very much  Social Connections: Somewhat Isolated (09/05/2022)   Received from [facility], [facility]   Social Network    How would you rate your social network (family, work, friends)?: Restricted participation with some degree of social isolation  Intimate Partner Violence: Not At Risk (01/28/2023)   Received from [facility], [facility]   HITS    Over the last 12 months how often did your partner physically hurt you?: 1    Over the last 12 months how often did your partner insult you or talk down to you?: 1    Over the last 12 months how often did your partner threaten you with physical harm?: 1    Over the last 12 months how often did your partner scream or curse at you?: 1   Family history: No premature CAD.   ROS: All systems reviewed and negative except as per HPI.   BP 110/70   Pulse (!) 51   Wt 90.3 kg (199 lb)   SpO2 98%   BMI 35.25 kg/m   Wt Readings from Last 3 Encounters:  04/20/23 90.3 kg (199 lb)  01/20/23 88.8 kg (195 lb 12.8 oz)  09/20/22 85.7 kg (189 lb)  General: NAD Neck: No JVD, no thyromegaly or thyroid nodule.  Lungs: Clear to auscultation bilaterally with normal respiratory effort. CV: Nondisplaced PMI.  Heart regular S1/S2, no S3/S4, no murmur.  No peripheral edema.  No carotid bruit.  Unable to palpate pedal pulses.  Abdomen: Soft, nontender, no hepatosplenomegaly, no distention.  Skin: Intact without lesions or rashes.  Neurologic: Alert and oriented x 3.  Psych: Normal affect. Extremities: No clubbing or cyanosis.  HEENT: Normal.   ASSESSMENT & PLAN: 1. Chronic Systolic CHF: Ischemic cardiomyopathy.  Echo at [facility] in 7/22 with EF 25-30%, moderate MR.  She was admitted to [facility] in 7/22 with CHF, started on inotropes and diuresed.  RHC showed elevated filling pressures and preserved CO (but was on inotropes at the time).  Unable to wean off inotropes successfully, sent to [facility] for consideration of LVAD. Able to wean off dobutamine and  milrinone successfully.  CPX showed mild HF limitation. Echo 10/22 showed EF up to 45-50%. Echo in 12/22 showed EF 50-55% with small apical aneurysm and no thrombus. Echo in 2/24 showed EF 40-45%,  mild RV dysfunction, normal IVC.  NYHA class II symptoms, less active with recent toe osteomyelitis.  - Stop losartan, start Entresto 24/26 bid.  BMET/BNP today and BMET in 10 days.  - With initiation of Entresto, will decrease torsemide to 20 mg daily.   - Continue Toprol XL 12.5 mg qhs, HR too low to increase.  - Continue Farxiga 10 mg daily.  - Continue spironolactone 25 mg daily.  - EF is out of ICD range.  2. CAD: s/p CABG x 4 (4/22).  Cath in 7/22 with 3/4 grafts patent, occluded SVG-small D.  Native vessels, however, were small and diseased (diabetic coronaries). No ischemic chest pain.  - Continue ASA 81 - With PAD and CAD, will stop Plavix and start rivaroxaban 2.5 mg bid. - Continue atorvastatin, good lipids 6/24.  3. CKD stage 3a: BMET today. 4. OSA: Needs to complete sleep study.  5. DM:  -Continue Farxiga 6. PAD: Severe tibioperoneal disease on left, not amenable to intervention. No claudication per her report.   Follow up 3 months with APP.   Marca Ancona, MD 04/20/23

## 2023-04-20 NOTE — Progress Notes (Signed)
Reminded patient during visit that she needs to complete [place] sleep study

## 2023-04-20 NOTE — Progress Notes (Signed)
Medication Samples have been provided to the patient.  Drug name: xarelto       Strength: 2.5 mg        Qty: 3  LOT: [id number]  Exp.Date: 06/2023  Dosing instructions: Take 1 tablet daily  The patient has been instructed regarding the correct time, dose, and frequency of taking this medication, including desired effects and most common side effects.   Smitty Cords Richard Ritchey 12:51 PM 04/20/2023

## 2023-04-20 NOTE — Patient Instructions (Signed)
Medication Changes:  STOP LOSARTAN   START: ENTRESTO 24/26MG  TWICE DAILY   STOP PLAVIX (CLOPIDOGREL)  START: XARELTO 2.5MG  TWICE DAILY   DECREASE TORSEMIDE TO 20MG  DAILY   Lab Work:  Labs done today, your results will be available in MyChart, we will contact you for abnormal readings.  THEN RETURN FOR LABS IN 10 DAYS- CAN GO TO A [facility] NEAR YOU FOR LAB WORK   Follow-Up in: 3 MONTHS WITH APP AS SCHEDULED   At the [facility], you and your health needs are our priority. We have a designated team specialized in the treatment of Heart Failure. This Care Team includes your primary Heart Failure Specialized Cardiologist (physician), Advanced Practice Providers (APPs- Physician Assistants and Nurse Practitioners), and Pharmacist who all work together to provide you with the care you need, when you need it.   You may see any of the following providers on your designated Care Team at your next follow up:  Dr. Arvilla Meres Dr. Marca Ancona Dr. Marcos Eke, NP Robbie Lis, [place] [facility] [place], [place] Brynda Peon, NP Karle Plumber, PharmD   Please be sure to bring in all your medications bottles to every appointment.   Need to Contact us:  If you have any questions or concerns before your next appointment please send [place] a message through [place] or call our office at [phone].    TO LEAVE A MESSAGE FOR THE NURSE SELECT OPTION 2, PLEASE LEAVE A MESSAGE INCLUDING: YOUR NAME DATE OF BIRTH CALL BACK NUMBER REASON FOR CALL**this is important as we prioritize the call backs  YOU WILL RECEIVE A CALL BACK THE SAME DAY AS LONG AS YOU CALL BEFORE 4:00 PM

## 2023-04-20 NOTE — Telephone Encounter (Signed)
Advanced Heart Failure Patient Advocate Encounter  Patient was seen in clinic and needs help affording Xarelto ($125/month while in the donut hole). Started application for [employer]. Patient is aware that 4% OOP is required in order to receive approval.  Will send in once all signatures are received.

## 2023-04-22 ENCOUNTER — Encounter (HOSPITAL_COMMUNITY): Payer: PRIVATE HEALTH INSURANCE | Admitting: Cardiology

## 2023-04-26 ENCOUNTER — Other Ambulatory Visit (HOSPITAL_COMMUNITY): Payer: Self-pay

## 2023-04-26 NOTE — Telephone Encounter (Addendum)
Advanced Heart Failure Patient Advocate Encounter  Sent in application via fax. Document scanned to chart.   Will follow up. 

## 2023-04-27 NOTE — Telephone Encounter (Signed)
Advanced Heart Failure Patient Advocate Encounter  Received a denial notification from [employer]. Called to get clarity on denial. Representative stated that the hh size and income reported on the application is different than the soft credit check. Was told we would either need to submit last years tax return or a letter of hardship from the office.   Called and left the patient a message. Will attempt letter of hardship and see if it is accepted.

## 2023-05-05 ENCOUNTER — Telehealth (HOSPITAL_COMMUNITY): Payer: Self-pay

## 2023-05-05 NOTE — Telephone Encounter (Signed)
Called patient to remind her to complete Itamar test before 05/13/23, if she can't do it before then I asked her to return it to office, otherwise she will be charged for the device.

## 2023-05-10 NOTE — Telephone Encounter (Signed)
Advanced Heart Failure Patient Advocate Encounter  Called [employer] to check the status of the patient's application. Representative stated that have not received the letter of hardship despite successful confirmation. Resent via fax.   Will follow up.

## 2023-05-11 ENCOUNTER — Encounter (INDEPENDENT_AMBULATORY_CARE_PROVIDER_SITE_OTHER): Payer: Medicare Other | Admitting: Cardiology

## 2023-05-11 DIAGNOSIS — I5043 Acute on chronic combined systolic (congestive) and diastolic (congestive) heart failure: Secondary | ICD-10-CM

## 2023-05-11 DIAGNOSIS — R0683 Snoring: Secondary | ICD-10-CM | POA: Diagnosis not present

## 2023-05-12 ENCOUNTER — Ambulatory Visit: Payer: Medicare Other | Attending: Cardiology

## 2023-05-12 DIAGNOSIS — I5042 Chronic combined systolic (congestive) and diastolic (congestive) heart failure: Secondary | ICD-10-CM

## 2023-05-12 NOTE — Procedures (Signed)
Patient Information Study Date: 05/12/2023 Patient Name: Andrea Mueller Patient ID: [id number] Birth Date: 1956/05/04 Age: 67 Gender: Female Referring Physician: Marca Ancona, MD  TEST DESCRIPTION: Home sleep apnea testing was completed using the WatchPat, a Type 1 device, utilizing peripheral arterial tonometry (PAT), chest movement, actigraphy, pulse oximetry, pulse rate, body position and snore. AHI was calculated with apnea and hypopnea using valid sleep time as the denominator. RDI includes apneas, hypopneas, and RERAs. The data acquired and the scoring of sleep and all associated events were performed in accordance with the recommended standards and specifications as outlined in the AASM Manual for the Scoring of Sleep and Associated Events 2.2.0 (2015).   FINDINGS: 1.  No evidence of Obstructive Sleep Apnea with AHI 4.9/hr.  2.  No Central Sleep Apnea. 3.  Oxygen desaturations as low as 87%. 4.  Mild to moderate snoring was present. O2 sats were < 88% for 0.4 minutes. 5.  Total sleep time was 9 hrs and 19 min. 6.  15.1% of total sleep time was spent in REM sleep.  7.  Normal sleep onset latency at 24 min.  8.  Normal REM sleep onset latency at 99 min.  9.  Total awakenings were 3.   DIAGNOSIS:  Normal study with no significant sleep disordered breathing.  RECOMMENDATIONS:   1. Normal study with no significant sleep disordered breathing.  2.  Healthy sleep recommendations include:  adequate nightly sleep (normal 7-9 hrs/night), avoidance of caffeine after noon and alcohol near bedtime, and maintaining a sleep environment that is cool, dark and quiet.  3.  Weight loss for overweight patients is recommended.    4.  Snoring recommendations include:  weight loss where appropriate, side sleeping, and avoidance of alcohol before bed.  5.  Operation of motor vehicle or dangerous equipment must be avoided when feeling drowsy, excessively sleepy, or mentally fatigued.    6.  An  ENT consultation which may be useful for specific causes of and possible treatment of bothersome snoring.   7. Weight loss may be of benefit in reducing the severity of snoring.   Signature: Armanda Magic, MD; [facility]; Diplomat, American Board of Sleep Medicine Electronically Signed: 05/12/2023 12:26:01 PM

## 2023-05-13 ENCOUNTER — Telehealth: Payer: Self-pay

## 2023-05-13 NOTE — Telephone Encounter (Signed)
-----   Message from Armanda Magic sent at 05/12/2023 12:28 PM EDT ----- Please let patient know that sleep study showed no significant sleep apnea.

## 2023-05-13 NOTE — Telephone Encounter (Signed)
 Left VM with callback number for patient to receive sleep study results and recommendations.

## 2023-05-20 NOTE — Telephone Encounter (Signed)
Advanced Heart Failure Patient Advocate Encounter  Called [employer] to check the status of the patient's application. Representative stated that they did receive the letter of appeal. They do not have an update on the appeal at this time. Was told there is no turn around time for the appeal process.  Will call back next week to follow up.

## 2023-05-24 ENCOUNTER — Other Ambulatory Visit (HOSPITAL_COMMUNITY): Payer: Self-pay | Admitting: Cardiology

## 2023-05-26 NOTE — Telephone Encounter (Signed)
Advanced Heart Failure Patient Advocate Encounter  Called [employer] to check the status of the patient's application. Representative stated that the appeal is being worked on as of today, so there is no update at this time. Suggested I call next week if I have not received a determination.  Will follow up.

## 2023-05-27 ENCOUNTER — Other Ambulatory Visit (HOSPITAL_COMMUNITY): Payer: Self-pay

## 2023-06-02 ENCOUNTER — Other Ambulatory Visit (HOSPITAL_COMMUNITY): Payer: Self-pay | Admitting: Cardiology

## 2023-06-13 ENCOUNTER — Encounter (HOSPITAL_COMMUNITY): Payer: Self-pay

## 2023-06-13 NOTE — Telephone Encounter (Signed)
Advanced Heart Failure Patient Advocate Encounter   Patient was approved to receive Xarelto from [employer]. Effective through 08/16/23. Called [employer]. The patient will receive the medication through [facility]. They have attempted to call the patient and have not been able to get in touch with her. I attempted to call the patient and the call did not go through.  Sending approval information via mychart as well.  Archer Asa, CPhT

## 2023-07-21 ENCOUNTER — Encounter (HOSPITAL_COMMUNITY): Payer: PRIVATE HEALTH INSURANCE

## 2023-08-18 NOTE — Progress Notes (Signed)
 Advanced Heart Failure Clinic Note    PCP: [place] Dragon, PA-C HF Cardiologist: Dr. Rolan  CC: HF follow up  HPI: Andrea Mueller is a 68 y.o. female with hx of CAD s/p 4v CABG (11/2020), type 2 diabetes mellitus, anxiety/depression, CKD IIIa, and ischemic cardiomyopathy.  She was diagnosed with coronary artery disease back in April 2022 after presenting to a hospital in [place] with fatigue and diaphoresis.  She underwent four-vessel CABG in [place] and appeared to initially recover quite well.  She returned to her home in [place] in May 2022, and was hospitalized 5/22 for decompensated heart failure and right lower leg cellulitis.  Her echo revealed normal LV systolic function.    Admit to [facility] in [facility] 07/22 with acute heart failure.  Echo revealed newly reduced LV systolic function with moderate mitral regurgitation. LHC demonstrated severe native disease, and occluded vein graft to a diagonal, and otherwise patent bypass grafts.  She had elevated biventricular filling pressures with a normal cardiac index, although this was on a small amount of dobutamine.  She was diuresed and inotropes wean was attempted. She was reportedly hypotensive, and inotropes were restarted.  She was transferred to [facility], on dobutamine and milrinone , for further management and advanced therapies. Inotropes were gradually weaned off.   CPX: 09/22: Mild HF limitation. Obesity playing a role. Mild chronotropic incompetence.  Echo 10/22: EF up to 45-50%.  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   Echo 2/24: EF 40-45%, mild RV dysfunction, normal IVC.    Had osteomyelitis involving left 2nd toe, now s/p amputation.  Peripheral angiography in 6/24 showed severe tibioperoneal disease on left without interventional option.   Sleep study 9/24 with no OSA  Today she returns for HF follow up with her son. Overall feeling  fine. She has chronic RLE swelling. No SOB walking on flat ground. Toe amputation has healed. Denies palpitations, abnormal bleeding, CP, dizziness, edema, or PND/Orthopnea. Appetite ok. No fever or chills. Weight at home 206 pounds. Taking all medications. Weight up a bit after the holidays, she is unsure if this is fluid vs caloric.  ReDs: 29%  ECG (personally reviewed): Sinus bradycardia  Labs (10/22): LDL 26 Labs (12/22): K 4.4, creatinine 1.33 Labs (4/23): K 5.1, creatinine 1.54 Labs (6/23): LDL 32, K 5, creatinine 1.32 Labs (10/23): K 5.4, creatinine 1.36 Labs (11/23): K 4.7, creatinine 1.23 Labs (02/24): K 4.2, creatinine 1.16, BNP 390, LDL 28,  Labs (04/24): A1c 8.8% Labs (6/24): LDL 33 Labs (8/24): K 5, creatinine 1.36 Labs (9/24): K 4.9, creatinine 1.33 Labs (12/24): K 4.8, creatinine 1.46  PMH: 1. CAD: CABG (4/22) with SVG-D, SVG-OM, LIMA-LAD, SVG RCA.  - Coronary angiography (7/22): Totally occluded mid LAD, LCx, and RCA; 80-90% stenosis small D1; patent LIMA-small, diseased LAD, patent SVG-RCA with small PDA and PLV, patent SVG-OM but 90% stenosis in small native vessel at anastomosis, occluded SVG-D.  Medical management.  2. Chronic systolic CHF: Ischemic cardiomyopathy.  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 - Echo (2/24): EF 40-45%, mild RV dysfunction, normal IVC.  3. CKD stage 3.  4. Type 2 diabetes.  5. Anxiety 6. Rheumatoid arthritis 7. 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  8. 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  9. PAD: Peripheral angiography in 6/24 showed severe tibioperoneal disease on left.   Current Outpatient Medications   Medication Sig Dispense Refill   acetaminophen  (TYLENOL ) 325 MG tablet Take 325 mg by mouth every 6 (six) hours as needed for moderate pain or headache.     aspirin  81 MG EC tablet Take 1 tablet (81 mg total) by mouth daily. Swallow whole. 30 tablet 11   atorvastatin  (LIPITOR ) 80 MG tablet Take 1 tablet by mouth once daily 30 tablet 11   busPIRone  (BUSPAR ) 5 MG tablet TAKE 1 TABLET BY MOUTH THREE TIMES DAILY 90 tablet 0   dapagliflozin  propanediol (FARXIGA ) 10 MG TABS tablet Take 1 tablet (10 mg total) by mouth daily. 90 tablet 3   insulin  NPH-regular Human (70-30) 100 UNIT/ML injection Inject 25-28 Units into the skin 2 (two) times daily with a meal. Patient takes 25 units in the morning and  only take 28 units at night.     LORazepam (ATIVAN) 0.5 MG tablet Take 0.25-0.5 mg by mouth daily as needed for anxiety.     melatonin 5 MG TABS Take 5 mg by mouth at bedtime.     metoprolol  succinate (TOPROL  XL) 25 MG 24 hr tablet Take 0.5 tablets (12.5 mg total) by mouth at bedtime. 45 tablet 3   Omega-3 Fatty Acids (FISH OIL) 1000 MG CAPS Take 1,000 mg by mouth daily.     rivaroxaban  (XARELTO ) 2.5 MG TABS tablet Take 1 tablet (2.5 mg total) by mouth 2 (two) times daily. 60 tablet 3   sacubitril-valsartan (ENTRESTO ) 24-26 MG Take 1 tablet by mouth twice daily 60 tablet 11   senna-docusate (SENOKOT-S) 8.6-50 MG tablet Take 1 tablet by mouth daily. As needed     sertraline  (ZOLOFT ) 50 MG tablet Take 1 tablet by mouth once daily 30 tablet 0   spironolactone  (ALDACTONE ) 25 MG tablet Take 25 mg by mouth daily.     torsemide  (DEMADEX ) 20 MG tablet Take 2 tablets by mouth once daily 180 tablet 3   metolazone  (ZAROXOLYN ) 2.5 MG tablet Take 2.5 mg by mouth once a week. As directed by AHF (Patient not taking: Reported on 08/19/2023)     No current facility-administered medications for this encounter.   Allergies  Allergen Reactions   Statins     Other reaction(s): Other Myalgia   Codeine Nausea Only    Social History   Socioeconomic History   Marital status: Widowed    Spouse name: Not on file   Number of children: Not on file   Years of education: Not on file   Highest education level: Not on file  Occupational History   Not on file  Tobacco Use   Smoking status: Former    Current packs/day: 0.00    Average packs/day: 1 pack/day for 15.0 years (15.0 ttl pk-yrs)    Types: Cigarettes    Start date: 08/16/1993    Quit date: 08/16/2000    Years since quitting: 23.0   Smokeless tobacco: Never  Substance and Sexual Activity   Alcohol use: Not Currently   Drug use: Not Currently   Sexual activity: Not Currently  Other Topics Concern   Not on file  Social History Narrative   Not on file   Social Drivers of Health   Financial Resource Strain: Medium Risk (10/10/2022)   Received from [facility], [facility]   Overall Financial Resource Strain (  CARDIA)    Difficulty of Paying Living Expenses: Somewhat hard  Food Insecurity: Patient Declined (10/10/2022)   Received from [facility], [facility]   Hunger Vital Sign    Worried About Running Out of Food in the Last Year: Patient declined    Ran Out of Food in the Last Year: Patient declined  Recent Concern: Food Insecurity - Food Insecurity Present (09/05/2022)   Received from [facility]   Hunger Vital Sign    Worried About Running Out of Food in the Last Year: Sometimes true    Ran Out of Food in the Last Year: Patient refused  Transportation Needs: No Transportation Needs (10/10/2022)   Received from [employer], [facility]   PRAPARE - Transportation    Lack of Transportation (Medical): No    Lack of Transportation (Non-Medical): No  Physical Activity: Insufficiently Active (10/10/2022)   Received from [facility], [facility]   Exercise Vital Sign    Days of Exercise per Week: 3 days    Minutes of Exercise per Session: 20 min  Stress: No Stress Concern Present (02/02/2023)   Received from [facility],  [facility] of Occupational Health - Occupational Stress Questionnaire    Feeling of Stress : Not at all  Recent Concern: Stress - Stress Concern Present (12/24/2022)   Received from [facility] of Occupational Health - Occupational Stress Questionnaire    Feeling of Stress : Very much  Social Connections: Somewhat Isolated (09/05/2022)   Received from [facility], [facility]   Social Network    How would you rate your social network (family, work, friends)?: Restricted participation with some degree of social isolation  Intimate Partner Violence: Not At Risk (01/28/2023)   Received from [facility], [facility]   HITS    Over the last 12 months how often did your partner physically hurt you?: Never    Over the last 12 months how often did your partner insult you or talk down to you?: Never    Over the last 12 months how often did your partner threaten you with physical harm?: Never    Over the last 12 months how often did your partner scream or curse at you?: Never   Family history: No premature CAD.   ROS: All systems reviewed and negative except as per HPI.   BP 118/66   Pulse (!) 50   Wt 93.7 kg (206 lb 9.6 oz)   SpO2 97%   BMI 36.60 kg/m   Wt Readings from Last 3 Encounters:  08/19/23 93.7 kg (206 lb 9.6 oz)  04/20/23 90.3 kg (199 lb)  01/20/23 88.8 kg (195 lb 12.8 oz)   Physical Exam General:  NAD. No resp difficulty, walked into clinic HEENT: Normal Neck: Supple. JVP 8-10, thick neck. Carotids 2+ bilat; no bruits. No lymphadenopathy or thryomegaly appreciated. Cor: PMI nondisplaced. Regular rate & rhythm. No rubs, gallops or murmurs. Lungs: Clear Abdomen: Soft, obese, nontender, nondistended. No hepatosplenomegaly. No bruits or masses. Good bowel sounds. Extremities: No cyanosis, clubbing, rash, 1+ RLE edema (chronic) Neuro: Alert & oriented x 3, cranial nerves grossly intact. Moves all 4 extremities w/o difficulty.  Affect pleasant.  ASSESSMENT & PLAN: 1. Chronic Systolic CHF: Ischemic cardiomyopathy.  Echo at [facility] in 7/22 with EF 25-30%, moderate MR.  She was admitted to [facility] in 7/22 with CHF, started on inotropes and diuresed.  RHC showed elevated filling pressures and preserved CO (but was on inotropes  at the time).  Unable to wean off inotropes successfully, sent to [facility] for consideration of LVAD. Able to wean off dobutamine and milrinone  successfully.  CPX showed mild HF limitation. Echo 10/22 showed EF up to 45-50%. Echo in 12/22 showed EF 50-55% with small apical aneurysm and no thrombus. Echo in 2/24 showed EF 40-45%, mild RV dysfunction, normal IVC. NYHA class II symptoms. She does not appear volume overloaded by exam, ReDs 29%. Suspect recent weight gain is more caloric - Continue Entresto  24/26 bid.  Recent labs reviewed and are stable, K 4.8, SCr 1.46 - Continue torsemide  40 mg daily.  Ok to take extra 20 mg torsemide  PRN - Continue Toprol  XL 12.5 mg qhs, HR too low to increase.  - Continue Farxiga  10 mg daily. No GU symptoms. - Continue spironolactone  25 mg daily.  - EF is out of ICD range.  - Update echo next visit. 2. CAD: s/p CABG x 4 (4/22).  Cath in 7/22 with 3/4 grafts patent, occluded SVG-small D.  Native vessels, however, were small and diseased (diabetic coronaries). No ischemic chest pain.  - Continue ASA 81. - With PAD and CAD, now off Plavix  and on rivaroxaban  2.5 mg bid. - Continue atorvastatin , good lipids 6/24.  3. CKD stage 3a: Continue SGLT2i. Most recent SCr 1.46.  4. Suspected sleep apnea: Sleep study with no evidence of OSA, AHI 4.9/hr. 5. DM: She is on insulin . A1C 9.8 (10/24). She has been referred to Endocrinology. Consider GLP1RA - Continue Farxiga . No GU symptoms. 6. PAD: Severe tibioperoneal disease on left, not amenable to intervention. No claudication per her report.  7. Obesity: Body mass index is 36.6 kg/m. - She would benefit from GLP1RA. Defer to PCP vs  endocrinology. She has follow up soon.  Follow up in 3 months with Dr. Rolan + echo  Harlene HERO [place], [place] 08/19/23

## 2023-08-19 ENCOUNTER — Other Ambulatory Visit (HOSPITAL_COMMUNITY): Payer: Self-pay

## 2023-08-19 ENCOUNTER — Encounter (HOSPITAL_COMMUNITY): Payer: Self-pay

## 2023-08-19 ENCOUNTER — Ambulatory Visit (HOSPITAL_COMMUNITY)
Admission: RE | Admit: 2023-08-19 | Discharge: 2023-08-19 | Disposition: A | Discharge status: Home / Self Care | Payer: Medicare Other | Source: Ambulatory Visit | Attending: Family Medicine | Admitting: Family Medicine

## 2023-08-19 ENCOUNTER — Other Ambulatory Visit (HOSPITAL_COMMUNITY): Payer: Self-pay | Admitting: Cardiology

## 2023-08-19 VITALS — BP 118/66 | HR 50 | Wt 206.6 lb

## 2023-08-19 DIAGNOSIS — I255 Ischemic cardiomyopathy: Secondary | ICD-10-CM | POA: Diagnosis not present

## 2023-08-19 DIAGNOSIS — Z794 Long term (current) use of insulin: Secondary | ICD-10-CM | POA: Insufficient documentation

## 2023-08-19 DIAGNOSIS — F419 Anxiety disorder, unspecified: Secondary | ICD-10-CM | POA: Insufficient documentation

## 2023-08-19 DIAGNOSIS — I2510 Atherosclerotic heart disease of native coronary artery without angina pectoris: Secondary | ICD-10-CM | POA: Insufficient documentation

## 2023-08-19 DIAGNOSIS — E1122 Type 2 diabetes mellitus with diabetic chronic kidney disease: Secondary | ICD-10-CM | POA: Insufficient documentation

## 2023-08-19 DIAGNOSIS — I739 Peripheral vascular disease, unspecified: Secondary | ICD-10-CM | POA: Insufficient documentation

## 2023-08-19 DIAGNOSIS — Z79899 Other long term (current) drug therapy: Secondary | ICD-10-CM | POA: Insufficient documentation

## 2023-08-19 DIAGNOSIS — Z7902 Long term (current) use of antithrombotics/antiplatelets: Secondary | ICD-10-CM | POA: Insufficient documentation

## 2023-08-19 DIAGNOSIS — Z6836 Body mass index (BMI) 36.0-36.9, adult: Secondary | ICD-10-CM | POA: Diagnosis not present

## 2023-08-19 DIAGNOSIS — I5042 Chronic combined systolic (congestive) and diastolic (congestive) heart failure: Secondary | ICD-10-CM

## 2023-08-19 DIAGNOSIS — I25810 Atherosclerosis of coronary artery bypass graft(s) without angina pectoris: Secondary | ICD-10-CM | POA: Diagnosis not present

## 2023-08-19 DIAGNOSIS — N183 Chronic kidney disease, stage 3 unspecified: Secondary | ICD-10-CM

## 2023-08-19 DIAGNOSIS — F32A Depression, unspecified: Secondary | ICD-10-CM | POA: Insufficient documentation

## 2023-08-19 DIAGNOSIS — I5022 Chronic systolic (congestive) heart failure: Secondary | ICD-10-CM | POA: Diagnosis not present

## 2023-08-19 DIAGNOSIS — N1831 Chronic kidney disease, stage 3a: Secondary | ICD-10-CM | POA: Insufficient documentation

## 2023-08-19 DIAGNOSIS — E669 Obesity, unspecified: Secondary | ICD-10-CM | POA: Insufficient documentation

## 2023-08-19 NOTE — Progress Notes (Signed)
 ReDS Vest / Clip - 08/19/23 1500       ReDS Vest / Clip   Station Marker B    Ruler Value 34    ReDS Value Range Low volume    ReDS Actual Value 29

## 2023-08-19 NOTE — Patient Instructions (Signed)
 There has been no changes to your medications.  Your physician has requested that you have an echocardiogram. Echocardiography is a painless test that uses sound waves to create images of your heart. It provides your doctor with information about the size and shape of your heart and how well your heart's chambers and valves are working. This procedure takes approximately one hour. There are no restrictions for this procedure. Please do NOT wear cologne, perfume, aftershave, or lotions (deodorant is allowed). Please arrive 15 minutes prior to your appointment time.  Please note: We ask at that you not bring children with you during ultrasound (echo/ vascular) testing. Due to room size and safety concerns, children are not allowed in the ultrasound rooms during exams. Our front office staff cannot provide observation of children in our lobby area while testing is being conducted. An adult accompanying a patient to their appointment will only be allowed in the ultrasound room at the discretion of the ultrasound technician under special circumstances. We apologize for any inconvenience.  Your physician recommends that you schedule a follow-up appointment in: 3 months with an echocardiogram ( April) ** PLEASE CALL THE OFFICE IN MID FEBRUARY TO ARRANGE YOUR FOLLOW UP APPOINTMENT.**  If you have any questions or concerns before your next appointment please send [place] a message through [place] or call our office at [phone].    TO LEAVE A MESSAGE FOR THE NURSE SELECT OPTION 2, PLEASE LEAVE A MESSAGE INCLUDING: YOUR NAME DATE OF BIRTH CALL BACK NUMBER REASON FOR CALL**this is important as we prioritize the call backs  YOU WILL RECEIVE A CALL BACK THE SAME DAY AS LONG AS YOU CALL BEFORE 4:00 PM  At the [facility], you and your health needs are our priority. As part of our continuing mission to provide you with exceptional heart care, we have created designated Provider Care Teams. These  Care Teams include your primary Cardiologist (physician) and Advanced Practice Providers (APPs- Physician Assistants and Nurse Practitioners) who all work together to provide you with the care you need, when you need it.   You may see any of the following providers on your designated Care Team at your next follow up: Dr Arvilla Meres Dr Marca Ancona Dr. Dorthula Nettles Dr. Clearnce Hasten Amy Filbert Schilder, NP Robbie Lis, [place] [facility], [place] Brynda Peon, NP [place] Lee, NP Karle Plumber, PharmD   Please be sure to bring in all your medications bottles to every appointment.    Thank you for choosing [facility]

## 2023-09-06 ENCOUNTER — Telehealth (HOSPITAL_COMMUNITY): Payer: Self-pay | Admitting: Pharmacy Technician

## 2023-09-06 MED ORDER — XARELTO 2.5 MG PO TABS: 2.5000 mg | ORAL_TABLET | Freq: Two times a day (BID) | ORAL | 3 refills | Status: AC

## 2023-09-06 NOTE — Addendum Note (Signed)
Addended by: Theresia Bough on: 09/06/2023 04:43 PM   Modules accepted: Orders

## 2023-09-06 NOTE — Telephone Encounter (Signed)
Advanced Heart Failure Patient Advocate Encounter   Patient was approved to receive Xarelto from [employer]  Effective 08/18/23 - "up to one calendar year"  Sent 90 day RX request to Chantel (CMA) to send to [employer] (per approval notification. Document scanned to chart.   Archer Asa, CPhT

## 2024-01-06 ENCOUNTER — Ambulatory Visit (HOSPITAL_COMMUNITY): Payer: Self-pay | Admitting: Family Medicine

## 2024-01-06 ENCOUNTER — Ambulatory Visit (HOSPITAL_BASED_OUTPATIENT_CLINIC_OR_DEPARTMENT_OTHER)
Admission: RE | Admit: 2024-01-06 | Discharge: 2024-01-06 | Disposition: A | Discharge status: Home / Self Care | Source: Ambulatory Visit | Attending: Family Medicine | Admitting: Family Medicine

## 2024-01-06 ENCOUNTER — Ambulatory Visit (HOSPITAL_COMMUNITY): Payer: Self-pay | Admitting: Cardiology

## 2024-01-06 ENCOUNTER — Ambulatory Visit (HOSPITAL_COMMUNITY)
Admission: RE | Admit: 2024-01-06 | Discharge: 2024-01-06 | Disposition: A | Discharge status: Home / Self Care | Source: Ambulatory Visit | Attending: Cardiology | Admitting: Cardiology

## 2024-01-06 ENCOUNTER — Encounter (HOSPITAL_COMMUNITY): Payer: Self-pay | Admitting: Cardiology

## 2024-01-06 VITALS — BP 110/60 | HR 60 | Wt 209.2 lb

## 2024-01-06 DIAGNOSIS — Z6837 Body mass index (BMI) 37.0-37.9, adult: Secondary | ICD-10-CM | POA: Insufficient documentation

## 2024-01-06 DIAGNOSIS — Z951 Presence of aortocoronary bypass graft: Secondary | ICD-10-CM | POA: Insufficient documentation

## 2024-01-06 DIAGNOSIS — I739 Peripheral vascular disease, unspecified: Secondary | ICD-10-CM

## 2024-01-06 DIAGNOSIS — I4510 Unspecified right bundle-branch block: Secondary | ICD-10-CM | POA: Insufficient documentation

## 2024-01-06 DIAGNOSIS — I5022 Chronic systolic (congestive) heart failure: Secondary | ICD-10-CM | POA: Insufficient documentation

## 2024-01-06 DIAGNOSIS — Z794 Long term (current) use of insulin: Secondary | ICD-10-CM | POA: Insufficient documentation

## 2024-01-06 DIAGNOSIS — Z7984 Long term (current) use of oral hypoglycemic drugs: Secondary | ICD-10-CM | POA: Insufficient documentation

## 2024-01-06 DIAGNOSIS — Z87891 Personal history of nicotine dependence: Secondary | ICD-10-CM | POA: Diagnosis not present

## 2024-01-06 DIAGNOSIS — E1122 Type 2 diabetes mellitus with diabetic chronic kidney disease: Secondary | ICD-10-CM | POA: Diagnosis not present

## 2024-01-06 DIAGNOSIS — E1151 Type 2 diabetes mellitus with diabetic peripheral angiopathy without gangrene: Secondary | ICD-10-CM | POA: Diagnosis not present

## 2024-01-06 DIAGNOSIS — I255 Ischemic cardiomyopathy: Secondary | ICD-10-CM | POA: Insufficient documentation

## 2024-01-06 DIAGNOSIS — Z7901 Long term (current) use of anticoagulants: Secondary | ICD-10-CM | POA: Insufficient documentation

## 2024-01-06 DIAGNOSIS — I5042 Chronic combined systolic (congestive) and diastolic (congestive) heart failure: Secondary | ICD-10-CM

## 2024-01-06 DIAGNOSIS — E669 Obesity, unspecified: Secondary | ICD-10-CM | POA: Diagnosis not present

## 2024-01-06 DIAGNOSIS — F418 Other specified anxiety disorders: Secondary | ICD-10-CM | POA: Diagnosis not present

## 2024-01-06 DIAGNOSIS — R079 Chest pain, unspecified: Secondary | ICD-10-CM | POA: Diagnosis not present

## 2024-01-06 DIAGNOSIS — N1831 Chronic kidney disease, stage 3a: Secondary | ICD-10-CM | POA: Insufficient documentation

## 2024-01-06 DIAGNOSIS — I2510 Atherosclerotic heart disease of native coronary artery without angina pectoris: Secondary | ICD-10-CM | POA: Insufficient documentation

## 2024-01-06 DIAGNOSIS — Z006 Encounter for examination for normal comparison and control in clinical research program: Secondary | ICD-10-CM

## 2024-01-06 LAB — LIPID PANEL
Cholesterol: 139 mg/dL (ref 0–200)
HDL: 43 mg/dL (ref >40)
LDL Cholesterol: 66 mg/dL (ref 0–99)
Total CHOL/HDL Ratio: 3.2 ratio
Triglycerides: 152 mg/dL — ABNORMAL HIGH (ref <150)
VLDL: 30 mg/dL (ref 0–40)

## 2024-01-06 LAB — BASIC METABOLIC PANEL WITH GFR
Anion gap: 10 (ref 5–15)
BUN: 45 mg/dL — ABNORMAL HIGH (ref 8–23)
CO2: 27 mmol/L (ref 22–32)
Calcium: 9.4 mg/dL (ref 8.9–10.3)
Chloride: 100 mmol/L (ref 98–111)
Creatinine, Ser: 1.36 mg/dL — ABNORMAL HIGH (ref 0.44–1.00)
GFR, Estimated: 43 mL/min — ABNORMAL LOW (ref >60)
Glucose, Bld: 295 mg/dL — ABNORMAL HIGH (ref 70–99)
Potassium: 4.9 mmol/L (ref 3.5–5.1)
Sodium: 137 mmol/L (ref 135–145)

## 2024-01-06 LAB — CBC
HCT: 43.4 % (ref 36.0–46.0)
Hemoglobin: 14.1 g/dL (ref 12.0–15.0)
MCH: 27.9 pg (ref 26.0–34.0)
MCHC: 32.5 g/dL (ref 30.0–36.0)
MCV: 85.9 fL (ref 80.0–100.0)
Platelets: 160 10*3/uL (ref 150–400)
RBC: 5.05 MIL/uL (ref 3.87–5.11)
RDW: 14.6 % (ref 11.5–15.5)
WBC: 9 10*3/uL (ref 4.0–10.5)
nRBC: 0 % (ref 0.0–0.2)

## 2024-01-06 LAB — ECHOCARDIOGRAM COMPLETE
AR max vel: 1.88 cm2
AV Area VTI: 2.32 cm2
AV Area mean vel: 2.05 cm2
AV Mean grad: 3 mmHg
AV Peak grad: 6.5 mmHg
Ao pk vel: 1.27 m/s
Area-P 1/2: 3.05 cm2
Calc EF: 48.1 %
Est EF: 45
S' Lateral: 3 cm
Single Plane A2C EF: 53.3 %
Single Plane A4C EF: 43.5 %

## 2024-01-06 LAB — BRAIN NATRIURETIC PEPTIDE: B Natriuretic Peptide: 320.2 pg/mL — ABNORMAL HIGH (ref 0.0–100.0)

## 2024-01-06 MED ORDER — METOPROLOL SUCCINATE ER 25 MG PO TB24: 25.0000 mg | ORAL_TABLET | Freq: Every day | ORAL | 3 refills | Status: DC

## 2024-01-06 NOTE — Patient Instructions (Signed)
 INCREASE Toprol  XL to 25 mg nightly.  Labs done today, your results will be available in MyChart, we will contact you for abnormal readings.  You have been referred to the [facility]ART CARE PHARM[facility]. They will call you to arrange your appointment.  Your provider has ordered ABI's for you. You will be called to have this test arranged.  Your physician recommends that you schedule a follow-up appointment in: 4 months.  If you have any questions or concerns before your next appointment please send us  a message through [place] or call our office at [phone].    TO LEAVE A MESSAGE FOR THE NURSE SELECT OPTION 2, PLEASE LEAVE A MESSAGE INCLUDING: YOUR NAME DATE OF BIRTH CALL BACK NUMBER REASON FOR CALL**this is important as we prioritize the call backs  YOU WILL RECEIVE A CALL BACK THE SAME DAY AS LONG AS YOU CALL BEFORE 4:00 PM  At the [facility], you and your health needs are our priority. As part of our continuing mission to provide you with exceptional heart care, we have created designated Provider Care Teams. These Care Teams include your primary Cardiologist (physician) and Advanced Practice Providers (APPs- Physician Assistants and Nurse Practitioners) who all work together to provide you with the care you need, when you need it.   You may see any of the following providers on your designated Care Team at your next follow up: Dr Jules Oar Dr Peder Bourdon Dr. Alwin Baars Dr. Arta Lark Amy Marijane Shoulders, NP Ruddy Corral, [place] [facility] [place], [place] Dennise Fitz, NP [place] Lee, NP Shawnee Dellen, NP Luster Salters, PharmD Bevely Brush, PharmD   Please be sure to bring in all your medications bottles to every appointment.    Thank you for choosing [facility]

## 2024-01-07 NOTE — Progress Notes (Signed)
 [facility] Note    PCP: Rock Chum, PA-C HF Cardiologist: Dr. Mitzie Anda  Chief complaint: CHF  HPI: Andrea Mueller is a 68 y.o. female with hx of CAD s/p 4v CABG (11/2020), type 2 diabetes mellitus, anxiety/depression, CKD IIIa, and ischemic cardiomyopathy.  She was diagnosed with coronary artery disease back in April 2022 after presenting to a hospital in [place] with fatigue and diaphoresis.  She underwent four-vessel CABG in [place] and appeared to initially recover quite well.  She returned to her home in [place] in May 2022, and was hospitalized 5/22 for decompensated heart failure and right lower leg cellulitis.  Her echo revealed normal LV systolic function.    Admit to [facility] in [facility] 07/22 with acute heart failure.  Echo revealed newly reduced LV systolic function with moderate mitral regurgitation. LHC demonstrated severe native disease, and occluded vein graft to a diagonal, and otherwise patent bypass grafts.  She had elevated biventricular filling pressures with a normal cardiac index, although this was on a small amount of dobutamine.  She was diuresed and inotropes wean was attempted. She was reportedly hypotensive, and inotropes were restarted.  She was transferred to [facility], on dobutamine and milrinone , for further management and advanced therapies. Inotropes were gradually weaned off.   CPX: 09/22 showed mild HF limitation. Obesity playing a role. Mild chronotropic incompetence.  Echo 10/22 showed EF up to 45-50%.  Echo 12/22 showed EF 50-55% with apical aneurysm, no LV thrombus, mildly decreased RV systolic function, IVC normal.   Echo 2/24 showed EF 40-45%, mild RV dysfunction, normal IVC.    Had osteomyelitis involving left 2nd toe, now s/p amputation.  Peripheral angiography in 6/24 showed severe tibioperoneal disease on left without interventional option.   Sleep study 9/24 with no OSA.   Echo was done today and reviewed, EF  45-50% with basal inferior akinesis and basal inferolateral hypokinesis, mild RV dilation with normal RV systolic function, IVC normal.   Today she returns for HF follow up with her son. Weight up 3 lbs.  She is not taking metolazone .  She is walking 15-20 minutes several days/week on a treadmill.  She can walk on the treadmill without dyspnea but she is short of breath walking up stairs or walking more than about 50 yards at a faster pace. No orthopnea/PND.  No chest pain.  She has chronic knee pain.  No claudication.   ECG (personally reviewed): NSR, 1st degree AVB, RBBB, inferior Qs, anterior Qs  Labs (02/24): K 4.2, creatinine 1.16, BNP 390, LDL 28,  Labs (04/24): A1c 8.8% Labs (6/24): LDL 33 Labs (8/24): K 5, creatinine 1.36 Labs (9/24): K 4.9, creatinine 1.33 Labs (12/24): K 4.8, creatinine 1.46  PMH: 1. CAD: CABG (4/22) with SVG-D, SVG-OM, LIMA-LAD, SVG RCA.  - Coronary angiography (7/22): Totally occluded mid LAD, LCx, and RCA; 80-90% stenosis small D1; patent LIMA-small, diseased LAD, patent SVG-RCA with small PDA and PLV, patent SVG-OM but 90% stenosis in small native vessel at anastomosis, occluded SVG-D.  Medical management.  2. Chronic systolic CHF: Ischemic cardiomyopathy.  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 - Echo (2/24): EF 40-45%, mild RV dysfunction, normal IVC.  -  Echo (5/25): EF 45-50% with basal inferior akinesis and basal inferolateral hypokinesis, mild RV dilation with normal RV systolic function, IVC normal.  3. CKD stage 3.  4. Type 2 diabetes.  5. Anxiety 6. Rheumatoid arthritis 7. 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  8. Left 2nd toe  osteomyelitis: s/p amputation.  9. PAD: Peripheral angiography in 6/24 showed severe tibioperoneal disease on left.   Current Outpatient Medications  Medication Sig Dispense Refill   acetaminophen  (TYLENOL ) 325 MG tablet Take 325 mg by mouth every 6 (six) hours as needed for moderate pain or headache.     aspirin  81 MG EC tablet Take 1 tablet (81 mg total) by mouth daily. Swallow whole. 30 tablet 11   atorvastatin  (LIPITOR ) 80 MG tablet Take 1 tablet by mouth once daily 30 tablet 11   busPIRone  (BUSPAR ) 5 MG tablet Take 5 mg by mouth 2 (two) times daily.     dapagliflozin  propanediol (FARXIGA ) 10 MG TABS tablet Take 1 tablet (10 mg total) by mouth daily. 90 tablet 3   insulin  NPH-regular Human (70-30) 100 UNIT/ML injection Inject 25-28 Units into the skin 2 (two) times daily with a meal. Patient takes 25 units in the morning and  only take 28 units at night.     melatonin 5 MG TABS Take 5 mg by mouth at bedtime.     metolazone  (ZAROXOLYN ) 2.5 MG tablet Take 2.5 mg by mouth once a week. As directed by [facility]     Omega-3 Fatty Acids (FISH OIL) 1000 MG CAPS Take 1,000 mg by mouth daily.     rivaroxaban  (XARELTO ) 2.5 MG TABS tablet Take 1 tablet (2.5 mg total) by mouth 2 (two) times daily. 180 tablet 3   sacubitril-valsartan (ENTRESTO ) 24-26 MG Take 1 tablet by mouth twice daily 60 tablet 11   senna-docusate (SENOKOT-S) 8.6-50 MG tablet Take 1 tablet by mouth daily. As needed     sertraline  (ZOLOFT ) 50 MG tablet Take 1 tablet by mouth once daily 30 tablet 0   spironolactone  (ALDACTONE ) 25 MG tablet Take 25 mg by mouth daily.     torsemide  (DEMADEX ) 20 MG tablet Take 2 tablets by mouth once daily 180 tablet 3   LORazepam (ATIVAN) 0.5 MG tablet Take 0.25-0.5 mg by mouth daily as needed for anxiety. (Patient not taking: Reported on 01/06/2024)     metoprolol  succinate (TOPROL  XL) 25 MG 24 hr tablet Take 1 tablet (25 mg total) by mouth at bedtime. 90 tablet 3   No current facility-administered medications  for this encounter.   Allergies  Allergen Reactions   Statins     Other reaction(s): Other Myalgia   Codeine Nausea Only   Social History   Socioeconomic History   Marital status: Widowed    Spouse name: Not on file   Number of children: Not on file   Years of education: Not on file   Highest education level: Not on file  Occupational History   Not on file  Tobacco Use   Smoking status: Former    Current packs/day: 0.00    Average packs/day: 1 pack/day for 15.0 years (15.0 ttl pk-yrs)    Types: Cigarettes    Start date: 08/16/1993    Quit date: 08/16/2000    Years since quitting: 23.4   Smokeless tobacco: Never  Substance and Sexual Activity   Alcohol use: Not Currently   Drug use: Not Currently   Sexual activity: Not Currently  Other Topics Concern   Not on file  Social History Narrative  Not on file   Social Drivers of Health   Financial Resource Strain: Medium Risk (10/10/2022)   Received from [facility], [facility]   Overall Financial Resource Strain (CARDIA)    Difficulty of Paying Living Expenses: Somewhat hard  Food Insecurity: Patient Declined (10/10/2022)   Received from [facility], [facility]   Hunger Vital Sign    Worried About Running Out of Food in the Last Year: Patient declined    Ran Out of Food in the Last Year: Patient declined  Recent Concern: Food Insecurity - Food Insecurity Present (09/05/2022)   Received from [facility]   Hunger Vital Sign    Worried About Running Out of Food in the Last Year: Sometimes true    Ran Out of Food in the Last Year: Patient refused  Transportation Needs: No Transportation Needs (10/10/2022)   Received from [facility], [facility]   PRAPARE - Transportation    Lack of Transportation (Medical): No    Lack of Transportation (Non-Medical): No  Physical Activity: Insufficiently Active (10/10/2022)   Received from [facility], [facility]   Exercise Vital Sign    Days of Exercise per Week: 3  days    Minutes of Exercise per Session: 20 min  Stress: No Stress Concern Present (02/02/2023)   Received from [facility], [facility] - Occupational Stress Questionnaire    Feeling of Stress : Not at all  Recent Concern: Stress - Stress Concern Present (12/24/2022)   Received from [facility] - Occupational Stress Questionnaire    Feeling of Stress : Very much  Social Connections: Somewhat Isolated (09/05/2022)   Received from [facility], [facility]   Social Network    How would you rate your social network (family, work, friends)?: Restricted participation with some degree of social isolation  Intimate Partner Violence: Not At Risk (01/28/2023)   Received from [facility], [facility]   HITS    Over the last 12 months how often did your partner physically hurt you?: Never    Over the last 12 months how often did your partner insult you or talk down to you?: Never    Over the last 12 months how often did your partner threaten you with physical harm?: Never    Over the last 12 months how often did your partner scream or curse at you?: Never   Family history: No premature CAD.   ROS: All systems reviewed and negative except as per HPI.   BP 110/60   Pulse 60   Wt 94.9 kg (209 lb 3.2 oz)   SpO2 99%   BMI 37.06 kg/m   Wt Readings from Last 3 Encounters:  01/06/24 94.9 kg (209 lb 3.2 oz)  08/19/23 93.7 kg (206 lb 9.6 oz)  04/20/23 90.3 kg (199 lb)  General: NAD Neck: No JVD, no thyromegaly or thyroid nodule.  Lungs: Clear to auscultation bilaterally with normal respiratory effort. CV: Nondisplaced PMI.  Heart regular S1/S2, no S3/S4, no murmur.  No peripheral edema.  No carotid bruit.  Difficult to palpate pedal pulses.  Abdomen: Soft, nontender, no hepatosplenomegaly, no distention.  Skin: Intact without lesions or rashes.  Neurologic: Alert and oriented x 3.  Psych: Normal  affect. Extremities: No clubbing or cyanosis.  HEENT: Normal.   ASSESSMENT & PLAN: 1. Chronic Systolic CHF: Ischemic cardiomyopathy.  Echo at [facility] in 7/22 with EF 25-30%, moderate MR.  She was  admitted to [facility] in 7/22 with CHF, started on inotropes and diuresed.  RHC showed elevated filling pressures and preserved CO (but was on inotropes at the time).  Unable to wean off inotropes successfully, sent to [facility] for consideration of LVAD. Able to wean off dobutamine and milrinone  successfully.  CPX showed mild HF limitation. Echo 10/22 showed EF up to 45-50%. Echo in 12/22 showed EF 50-55% with small apical aneurysm and no thrombus. Echo in 2/24 showed EF 40-45%, mild RV dysfunction, normal IVC. Echo today showed EF 45-50% with basal inferior akinesis and basal inferolateral hypokinesis, mild RV dilation with normal RV systolic function, IVC normal. NYHA class II-III, not volume overloaded on exam.  Suspect weight gain is caloric.  - Continue Entresto  24/26 bid.  BMET/BNP today.  - Continue torsemide  40 mg daily.   - Increase Toprol  XL to 25 mg daily.   - Continue Farxiga  10 mg daily.  - Continue spironolactone  25 mg daily.  - EF is out of ICD range.  2. CAD: s/p CABG x 4 (4/22).  Cath in 7/22 with 3/4 grafts patent, occluded SVG-small D.  Native vessels, however, were small and diseased (diabetic coronaries). No ischemic chest pain.  - Continue ASA 81. - With PAD and CAD, now off Plavix  and on rivaroxaban  2.5 mg bid. CBC today.  - Continue atorvastatin , check lipids today.  3. CKD stage 3a: Most recent SCr 1.38.  - Check BMET today.  - Continue Farxiga .   4. Suspected sleep apnea: Sleep study with no evidence of OSA, AHI 4.9/hr. 5. PAD: Severe tibioperoneal disease on left, not amenable to intervention. No claudication per her report.  - Check peripheral arterial dopplers.  7. Obesity: Body mass index is 37.06 kg/m. - I will refer her to pharmacy clinic to see if she can get semaglutide or  tirzepatide.   Follow up in 4 months with APP.   I spent 31 minutes reviewing records, interviewing/examining patient, and managing orders.   Peder Bourdon, MD 01/07/24

## 2024-01-10 NOTE — Research (Addendum)
 SITE: [id number]     Subject # [id number]   Subprotocol: A  Inclusion Criteria  Patients who meet all of the following criteria are eligible for enrollment as study participants:  Yes No  Age > 67 years old X   Eligible to wear Holter Study X    Exclusion Criteria  Patients who meet any of these criteria are not eligible for enrollment as study participants: Yes No  1. Receiving any mechanical (respiratory or circulatory) or renal support therapy at Screening or during Visit #1.  X  2.  Any other conditions that in the opinion of the investigators are likely to prevent compliance with the study protocol or pose a safety concern if the subject participates in the study.  X  3. Poor tolerance, namely susceptible to severe skin allergies from ECG adhesive patch application.  X   Protocol: REV H    60 minute start window         Cor device must be applied, and the study initiated, no later than 60 minutes of completing the Echocardiogram                             HH:MM  Echo completion time  11:31  2.   Cor Study start time  11:41   30-Minute execution window  Once Cor Monitoring begins, 3 QT Med ECGs and the 15-minute rest period must be completed within a 30 minute window     HH:MM  3. QT Med ECG Completion time  11:47  4. Start of 15-Min sitting rest period  11:47  5. End of 15-Min rest period  12:02  6. Time of device removal  12:11   *Continue to use the Mobile App Event feature to log the Rest period windows and follow instructions on the EF-ACT Clinical Trial  Patient Instruction Card.  Describe any anomalies in Protocol execution in the Protocol Deviation Log    Residential Zip code [phone] (First 3 digits ONLY)                                           [facility] Informed Consent   Subject Name: Andrea Mueller  Subject met inclusion and exclusion criteria.  The informed consent form, study requirements and expectations were reviewed with the subject. Subject had opportunity to read  consent and questions and concerns were addressed prior to the signing of the consent form.  The subject verbalized understanding of the trial requirements.  The subject agreed to participate in the [facility] EF ACT trial and signed the informed consent at 11:37 on 06-Jan-2024.  The informed consent was obtained prior to performance of any protocol-specific procedures for the subject.  A copy of the signed informed consent was given to the subject and a copy was placed in the subject's medical record.   Andrea Mueller          Current Outpatient Medications:    acetaminophen  (TYLENOL ) 325 MG tablet, Take 325 mg by mouth every 6 (six) hours as needed for moderate pain or headache., Disp: , Rfl:    aspirin  81 MG EC tablet, Take 1 tablet (81 mg total) by mouth daily. Swallow whole., Disp: 30 tablet, Rfl: 11   atorvastatin  (LIPITOR ) 80 MG tablet, Take 1 tablet by mouth once daily, Disp: 30 tablet, Rfl: 11   busPIRone  (  BUSPAR ) 5 MG tablet, Take 5 mg by mouth 2 (two) times daily., Disp: , Rfl:    dapagliflozin  propanediol (FARXIGA ) 10 MG TABS tablet, Take 1 tablet (10 mg total) by mouth daily., Disp: 90 tablet, Rfl: 3   insulin  NPH-regular Human (70-30) 100 UNIT/ML injection, Inject 25-28 Units into the skin 2 (two) times daily with a meal. Patient takes 25 units in the morning and  only take 28 units at night., Disp: , Rfl:    LORazepam (ATIVAN) 0.5 MG tablet, Take 0.25-0.5 mg by mouth daily as needed for anxiety. (Patient not taking: Reported on 01/06/2024), Disp: , Rfl:    melatonin 5 MG TABS, Take 5 mg by mouth at bedtime., Disp: , Rfl:    metolazone  (ZAROXOLYN ) 2.5 MG tablet, Take 2.5 mg by mouth once a week. As directed by A[facility], Disp: , Rfl:    metoprolol  succinate (TOPROL  XL) 25 MG 24 hr tablet, Take 1 tablet (25 mg total) by mouth at bedtime., Disp: 90 tablet, Rfl: 3   Omega-3 Fatty Acids (FISH OIL) 1000 MG CAPS, Take 1,000 mg by mouth daily., Disp: , Rfl:    rivaroxaban  (XARELTO ) 2.5 MG TABS  tablet, Take 1 tablet (2.5 mg total) by mouth 2 (two) times daily., Disp: 180 tablet, Rfl: 3   sacubitril-valsartan (ENTRESTO ) 24-26 MG, Take 1 tablet by mouth twice daily, Disp: 60 tablet, Rfl: 11   senna-docusate (SENOKOT-S) 8.6-50 MG tablet, Take 1 tablet by mouth daily. As needed, Disp: , Rfl:    sertraline  (ZOLOFT ) 50 MG tablet, Take 1 tablet by mouth once daily, Disp: 30 tablet, Rfl: 0   spironolactone  (ALDACTONE ) 25 MG tablet, Take 25 mg by mouth daily., Disp: , Rfl:    torsemide  (DEMADEX ) 20 MG tablet, Take 2 tablets by mouth once daily, Disp: 180 tablet, Rfl: 3

## 2024-01-26 ENCOUNTER — Ambulatory Visit (HOSPITAL_COMMUNITY)
Admission: RE | Admit: 2024-01-26 | Discharge: 2024-01-26 | Disposition: A | Discharge status: Home / Self Care | Source: Ambulatory Visit | Attending: Cardiology | Admitting: Cardiology

## 2024-01-26 DIAGNOSIS — I739 Peripheral vascular disease, unspecified: Secondary | ICD-10-CM

## 2024-01-26 LAB — VAS US ABI WITH/WO TBI
Left ABI: 1
Right ABI: 1

## 2024-01-30 NOTE — Telephone Encounter (Signed)
 Called patient per Dr. Mitzie Anda with following ABI results:  Calcified vessels, ABIs probably not accurate.  Aggressive medical management of atherosclerosis.  Pt verbalized understanding of same. Confirmed f/u appointment in [facility] in September. No further questions at this time.

## 2024-03-08 ENCOUNTER — Ambulatory Visit: Admitting: Pharmacist

## 2024-04-25 ENCOUNTER — Telehealth: Payer: Self-pay | Admitting: Pharmacy Technician

## 2024-04-25 ENCOUNTER — Other Ambulatory Visit (HOSPITAL_COMMUNITY): Payer: Self-pay

## 2024-04-25 ENCOUNTER — Ambulatory Visit: Attending: Pharmacist | Admitting: Pharmacist

## 2024-04-25 ENCOUNTER — Telehealth: Payer: Self-pay | Admitting: Pharmacist

## 2024-04-25 NOTE — Telephone Encounter (Signed)
 Per pt calls   Ran test claim for [facility]. For a 28 day supply and the co-pay is 0.00 . PA is not needed at this time. Nothing saying this is a transition fill.   This test claim was processed through [facility]- copay amounts may vary at other pharmacies due to pharmacy/plan contracts, or as the patient moves through the different stages of their insurance plan.

## 2024-04-25 NOTE — Telephone Encounter (Signed)
 Patient is already on Ozempic along with basal insulin . DM is managed by [facility] endocrinology. Call patient to inform about [facility] approval apt reschedule for Sept 15 at 9:30

## 2024-04-25 NOTE — Progress Notes (Deleted)
 Patient ID: Andrea Mueller                 DOB: 1956-07-08                    MRN: [id number]     HPI: Andrea Mueller is a 68 y.o. female patient referred to pharmacy clinic by Dr.McLean to initiate GLP1-RA therapy. PMH is significant for CAD, CKD, T2DM,PAD and obesity. Most recent BMI *** kg/m .  Baseline weight and BMI: *** kg/m  Current weight and BMI: 209 lbs  kg/m  Current meds that affect weight: ****  *** If diabetic and on insulin /sulfonylurea, can consider reducing dose to reduce risk of hypoglycemia- NPH   Diet:   Exercise:   Family History:   Social History:   Labs: Lab Results  Component Value Date   HGBA1C 7.2 (H) 06/03/2021    Wt Readings from Last 1 Encounters:  01/06/24 209 lb 3.2 oz (94.9 kg)    BP Readings from Last 1 Encounters:  01/06/24 110/60   Pulse Readings from Last 1 Encounters:  01/06/24 60       Component Value Date/Time   CHOL 139 01/06/2024 1230   TRIG 152 (H) 01/06/2024 1230   HDL 43 01/06/2024 1230   CHOLHDL 3.2 01/06/2024 1230   VLDL 30 01/06/2024 1230   LDLCALC 66 01/06/2024 1230    Past Medical History:  Diagnosis Date   CAD (coronary artery disease), native coronary artery    CKD (chronic kidney disease) stage 3, GFR 30-59 ml/min (HCC)    Type 2 diabetes mellitus treated without insulin  (HCC)     Current Outpatient Medications on File Prior to Visit  Medication Sig Dispense Refill   acetaminophen  (TYLENOL ) 325 MG tablet Take 325 mg by mouth every 6 (six) hours as needed for moderate pain or headache.     aspirin  81 MG EC tablet Take 1 tablet (81 mg total) by mouth daily. Swallow whole. 30 tablet 11   atorvastatin  (LIPITOR ) 80 MG tablet Take 1 tablet by mouth once daily 30 tablet 11   busPIRone  (BUSPAR ) 5 MG tablet Take 5 mg by mouth 2 (two) times daily.     dapagliflozin  propanediol (FARXIGA ) 10 MG TABS tablet Take 1 tablet (10 mg total) by mouth daily. 90 tablet 3   insulin  NPH-regular Human (70-30) 100 UNIT/ML  injection Inject 25-28 Units into the skin 2 (two) times daily with a meal. Patient takes 25 units in the morning and  only take 28 units at night.     LORazepam (ATIVAN) 0.5 MG tablet Take 0.25-0.5 mg by mouth daily as needed for anxiety. (Patient not taking: Reported on 01/06/2024)     melatonin 5 MG TABS Take 5 mg by mouth at bedtime.     metolazone  (ZAROXOLYN ) 2.5 MG tablet Take 2.5 mg by mouth once a week. As directed by AHF     metoprolol  succinate (TOPROL  XL) 25 MG 24 hr tablet Take 1 tablet (25 mg total) by mouth at bedtime. 90 tablet 3   Omega-3 Fatty Acids (FISH OIL) 1000 MG CAPS Take 1,000 mg by mouth daily.     rivaroxaban  (XARELTO ) 2.5 MG TABS tablet Take 1 tablet (2.5 mg total) by mouth 2 (two) times daily. 180 tablet 3   sacubitril-valsartan (ENTRESTO ) 24-26 MG Take 1 tablet by mouth twice daily 60 tablet 11   senna-docusate (SENOKOT-S) 8.6-50 MG tablet Take 1 tablet by mouth daily. As needed     sertraline  (ZOLOFT )  50 MG tablet Take 1 tablet by mouth once daily 30 tablet 0   spironolactone  (ALDACTONE ) 25 MG tablet Take 25 mg by mouth daily.     torsemide  (DEMADEX ) 20 MG tablet Take 2 tablets by mouth once daily 180 tablet 3   No current facility-administered medications on file prior to visit.    Allergies  Allergen Reactions   Statins     Other reaction(s): Other Myalgia   Codeine Nausea Only     Assessment/Plan:  1. Weight loss -   No problem-specific Assessment & Plan notes found for this encounter.    Robbi Blanch, Pharm.D [place] [facility]. [facility] [address], [place], [place] [address] Phone: [phone]; Fax: [phone]

## 2024-04-25 NOTE — Progress Notes (Signed)
 Advanced Heart Failure Clinic Note    PCP: [place] Dragon, PA-C HF Cardiologist: Dr. Rolan  HPI: Andrea Mueller is a 68 y.o. female with hx of CAD s/p 4v CABG (11/2020), type 2 diabetes mellitus, anxiety/depression, CKD IIIa, and ischemic cardiomyopathy.  She was diagnosed with coronary artery disease back in April 2022 after presenting to a hospital in [place] with fatigue and diaphoresis.  She underwent four-vessel CABG in [place] and appeared to initially recover quite well.  She returned to her home in [place] in May 2022, and was hospitalized 5/22 for decompensated heart failure and right lower leg cellulitis.  Her echo revealed normal LV systolic function.    Admit to [facility] in [facility] 07/22 with acute heart failure.  Echo revealed newly reduced LV systolic function with moderate mitral regurgitation. LHC demonstrated severe native disease, and occluded vein graft to a diagonal, and otherwise patent bypass grafts.  She had elevated biventricular filling pressures with a normal cardiac index, although this was on a small amount of dobutamine.  She was diuresed and inotropes wean was attempted. She was reportedly hypotensive, and inotropes were restarted.  She was transferred to [facility], on dobutamine and milrinone , for further management and advanced therapies. Inotropes were gradually weaned off.   CPX: 09/22 showed mild HF limitation. Obesity playing a role. Mild chronotropic incompetence.  Echo 10/22 showed EF up to 45-50%.  Echo 12/22 showed EF 50-55% with apical aneurysm, no LV thrombus, mildly decreased RV systolic function, IVC normal.   Echo 2/24 showed EF 40-45%, mild RV dysfunction, normal IVC.    Had osteomyelitis involving left 2nd toe, now s/p amputation.  Peripheral angiography in 6/24 showed severe tibioperoneal disease on left without interventional option.   Sleep study 9/24 with no OSA.   Echo 5/25 showed EF 45-50% with basal inferior akinesis and  basal inferolateral hypokinesis, mild RV dilation with normal RV systolic function, IVC normal.   Today she returns for HF follow up with her son. Overall feeling fine. No SOB walking up steps or doing yardwork. Rare atypical chest pain, non exertional. Chest squeezing has resolved. Has felt fatigued after increase in Toprol  last visit. Denies palpitations, abnormal bleeding, dizziness, edema, or PND/Orthopnea. Appetite ok. Weight at home 194 pounds. Taking all medications. She is now on semaglutide, planning to switch to tirzepatide.  ECG (personally reviewed): none ordered today.  Labs (02/24): K 4.2, creatinine 1.16, BNP 390, LDL 28,  Labs (04/24): A1c 8.8% Labs (6/24): LDL 33 Labs (8/24): K 5, creatinine 1.36 Labs (9/24): K 4.9, creatinine 1.33 Labs (12/24): K 4.8, creatinine 1.46 Labs (5/25): K 4.9, creatinine 1.36, LDL 66  PMH: 1. CAD: CABG (4/22) with SVG-D, SVG-OM, LIMA-LAD, SVG RCA.  - Coronary angiography (7/22): Totally occluded mid LAD, LCx, and RCA; 80-90% stenosis small D1; patent LIMA-small, diseased LAD, patent SVG-RCA with small PDA and PLV, patent SVG-OM but 90% stenosis in small native vessel at anastomosis, occluded SVG-D.  Medical management.  2. Chronic systolic CHF: Ischemic cardiomyopathy.  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 - Echo (2/24): EF 40-45%, mild RV dysfunction, normal IVC.  - Echo (5/25): EF 45-50% with basal inferior akinesis and basal inferolateral hypokinesis, mild  RV dilation with normal RV systolic function, IVC normal.  3. CKD stage 3.  4. Type 2 diabetes.  5. Anxiety 6. Rheumatoid arthritis 7. 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   8. 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  9. PAD: Peripheral angiography in 6/24 showed severe tibioperoneal disease on left.   Current Outpatient Medications  Medication Sig Dispense Refill   acetaminophen  (TYLENOL ) 325 MG tablet Take 325 mg by mouth every 6 (six) hours as needed for moderate pain or headache.     aspirin  81 MG EC tablet Take 1 tablet (81 mg total) by mouth daily. Swallow whole. 30 tablet 11   atorvastatin  (LIPITOR ) 80 MG tablet Take 1 tablet by mouth once daily 30 tablet 11   busPIRone  (BUSPAR ) 5 MG tablet Take 5 mg by mouth 2 (two) times daily.     dapagliflozin  propanediol (FARXIGA ) 10 MG TABS tablet Take 1 tablet (10 mg total) by mouth daily. 90 tablet 3   insulin  NPH-regular Human (70-30) 100 UNIT/ML injection Inject 25-28 Units into the skin 2 (two) times daily with a meal. Patient takes 25 units in the morning and  only take 28 units at night.     LORazepam (ATIVAN) 0.5 MG tablet Take 0.25-0.5 mg by mouth daily as needed for anxiety.     melatonin 5 MG TABS Take 5 mg by mouth at bedtime.     metolazone  (ZAROXOLYN ) 2.5 MG tablet Take 2.5 mg by mouth once a week. As directed by AHF     metoprolol  succinate (TOPROL  XL) 25 MG 24 hr tablet Take 1 tablet (25 mg total) by mouth at bedtime. 90 tablet 3   Omega-3 Fatty Acids (FISH OIL) 1000 MG CAPS Take 1,000 mg by mouth daily.     OZEMPIC, 1 MG/DOSE, 4 MG/3ML SOPN Inject 1 mg into the skin once a week.     rivaroxaban  (XARELTO ) 2.5 MG TABS tablet Take 1 tablet (2.5 mg total) by mouth 2 (two) times daily. 180 tablet 3   sacubitril-valsartan (ENTRESTO ) 24-26 MG Take 1 tablet by mouth twice daily 60 tablet 11   senna-docusate (SENOKOT-S) 8.6-50 MG tablet Take 1 tablet by mouth daily. As needed     sertraline  (ZOLOFT ) 50 MG tablet Take 1 tablet by mouth once daily 30 tablet 0   spironolactone  (ALDACTONE ) 25 MG tablet Take 25 mg by mouth daily.     torsemide  (DEMADEX ) 20 MG tablet Take 2 tablets by mouth once daily 180  tablet 3   No current facility-administered medications for this encounter.   Allergies  Allergen Reactions   Statins     Other reaction(s): Other Myalgia   Codeine Nausea Only   Social History   Socioeconomic History   Marital status: Widowed    Spouse name: Not on file   Number of children: Not on file   Years of education: Not on file   Highest education level: Not on file  Occupational History   Not on file  Tobacco Use   Smoking status: Former    Current packs/day: 0.00    Average packs/day: 1 pack/day for 15.0 years (15.0 ttl pk-yrs)    Types: Cigarettes    Start date: 08/16/1993    Quit date: 08/16/2000    Years since quitting: 23.7   Smokeless tobacco: Never  Substance and Sexual Activity   Alcohol use: Not Currently   Drug use: Not Currently   Sexual activity: Not Currently  Other Topics Concern   Not on file  Social History Narrative  Not on file   Social Drivers of Health   Financial Resource Strain: Medium Risk (02/12/2024)   [facility]   Overall Financial Resource Strain (CARDIA)    Difficulty of Paying Living Expenses: Somewhat hard  Food Insecurity: Food Insecurity Present (02/12/2024)   Received [facility]   Hunger Vital Sign    Within the past 12 months, you worried that your food would run out before you got the money to buy more.: Sometimes true    Within the past 12 months, the food you bought just didn't last and you didn't have money to get more.: Sometimes true  Transportation Needs: No Transportation Needs (02/12/2024)   Received from [facility] - Transportation    Lack of Transportation (Medical): No    Lack of Transportation (Non-Medical): No  Physical Activity: Insufficiently Active (02/12/2024)   Received from [facility]   Exercise Vital Sign    On average, how many days per week do you engage in moderate to strenuous exercise (like a brisk walk)?: 3 days    On average, how many minutes do you  engage in exercise at this level?: 10 min  Stress: No Stress Concern Present (02/12/2024)   Received from [facility] - Occupational Stress Questionnaire    Feeling of Stress : Only a little  Social Connections: Somewhat Isolated (02/12/2024)   Received from [facility]   Social Network    How would you rate your social network (family, work, friends)?: Restricted participation with some degree of social isolation  Intimate Partner Violence: Not At Risk (02/12/2024)   Received from [facility]   HITS    Over the last 12 months how often did your partner physically hurt you?: Never    Over the last 12 months how often did your partner insult you or talk down to you?: Never    Over the last 12 months how often did your partner threaten you with physical harm?: Never    Over the last 12 months how often did your partner scream or curse at you?: Never   Family history: No premature CAD.   ROS: All systems reviewed and negative except as per HPI.   BP 118/60   Pulse (!) 54   Ht 5' 3 (1.6 m)   Wt 89.5 kg (197 lb 6.4 oz)   SpO2 97%   BMI 34.97 kg/m   Wt Readings from Last 3 Encounters:  04/27/24 89.5 kg (197 lb 6.4 oz)  01/06/24 94.9 kg (209 lb 3.2 oz)  08/19/23 93.7 kg (206 lb 9.6 oz)   Physical Exam General:  NAD. No resp difficulty, walked into clinic HEENT: Normal Neck: Supple. No JVD. Cor: Andrea Mueller rate & rhythm. No rubs, gallops or murmurs. Lungs: Clear Abdomen: Soft,obese, nontender, nondistended.  Extremities: No cyanosis, clubbing, rash, edema Neuro: Alert & oriented x 3, moves all 4 extremities w/o difficulty. Affect pleasant.  ASSESSMENT & PLAN: 1. Chronic Systolic CHF: Ischemic cardiomyopathy.  Echo at [facility] in 7/22 with EF 25-30%, moderate MR.  She was admitted to [facility] in 7/22 with CHF, started on inotropes and diuresed.  RHC showed elevated filling pressures and preserved CO (but was on inotropes at the time).   Unable to wean off inotropes successfully, sent to [facility] for consideration of LVAD. Able to wean off dobutamine and milrinone  successfully.  CPX showed mild HF limitation. Echo 10/22 showed EF up to 45-50%. Echo in 12/22 showed  EF 50-55% with small apical aneurysm and no thrombus. Echo in 2/24 showed EF 40-45%, mild RV dysfunction, normal IVC. Echo 5/25 showed EF 45-50% with basal inferior akinesis and basal inferolateral hypokinesis, mild RV dilation with normal RV systolic function, IVC normal. NYHA class II, not volume overloaded on exam.  - With fatigue and bradycardia, decrease Toprol  XL back to 12.5 mg at bedtime. - Continue Entresto  24/26 bid.  BMET today.  - Continue torsemide  40 mg daily.   - Continue Farxiga  10 mg daily.  - Continue spironolactone  25 mg daily.  - EF is out of ICD range.  2. CAD: s/p CABG x 4 (4/22).  Cath in 7/22 with 3/4 grafts patent, occluded SVG-small D.  Native vessels, however, were small and diseased (diabetic coronaries). No ischemic chest pain.  - Continue ASA 81. - With PAD and CAD, now off Plavix  and on rivaroxaban  2.5 mg bid. No bleeding issues. CBC today. - Continue atorvastatin  80 mg daily. 3. CKD stage 3a: She is followed by Nephrology. - Continue Farxiga .  BMET today 4. Suspected sleep apnea: Sleep study with no evidence of OSA, AHI 4.9/hr. 5. PAD: Severe tibioperoneal disease on left, not amenable to intervention. No claudication per her report.  - Peripheral arterial dopplers 6/25 showed calcified vessels, ABIs likely not accurate.  6. Obesity: Body mass index is 34.97 kg/m. - She is now on a GLP1  Follow up in 4 months with Dr. Rolan Harlene CHRISTELLA Glena, FNP 04/27/24

## 2024-04-27 ENCOUNTER — Ambulatory Visit (HOSPITAL_COMMUNITY)
Admission: RE | Admit: 2024-04-27 | Discharge: 2024-04-27 | Disposition: A | Discharge status: Home / Self Care | Source: Ambulatory Visit | Attending: Family Medicine | Admitting: Family Medicine

## 2024-04-27 ENCOUNTER — Encounter (HOSPITAL_COMMUNITY): Payer: Self-pay

## 2024-04-27 ENCOUNTER — Ambulatory Visit (HOSPITAL_COMMUNITY): Payer: Self-pay | Admitting: Family Medicine

## 2024-04-27 VITALS — BP 118/60 | HR 54 | Ht 63.0 in | Wt 197.4 lb

## 2024-04-27 DIAGNOSIS — I2510 Atherosclerotic heart disease of native coronary artery without angina pectoris: Secondary | ICD-10-CM | POA: Diagnosis not present

## 2024-04-27 DIAGNOSIS — Z7901 Long term (current) use of anticoagulants: Secondary | ICD-10-CM | POA: Diagnosis not present

## 2024-04-27 DIAGNOSIS — I739 Peripheral vascular disease, unspecified: Secondary | ICD-10-CM

## 2024-04-27 DIAGNOSIS — Z87891 Personal history of nicotine dependence: Secondary | ICD-10-CM | POA: Diagnosis not present

## 2024-04-27 DIAGNOSIS — Z79899 Other long term (current) drug therapy: Secondary | ICD-10-CM | POA: Diagnosis not present

## 2024-04-27 DIAGNOSIS — I5022 Chronic systolic (congestive) heart failure: Secondary | ICD-10-CM | POA: Diagnosis not present

## 2024-04-27 DIAGNOSIS — Z7984 Long term (current) use of oral hypoglycemic drugs: Secondary | ICD-10-CM | POA: Insufficient documentation

## 2024-04-27 DIAGNOSIS — I255 Ischemic cardiomyopathy: Secondary | ICD-10-CM | POA: Diagnosis present

## 2024-04-27 DIAGNOSIS — E1122 Type 2 diabetes mellitus with diabetic chronic kidney disease: Secondary | ICD-10-CM | POA: Insufficient documentation

## 2024-04-27 DIAGNOSIS — Z951 Presence of aortocoronary bypass graft: Secondary | ICD-10-CM | POA: Insufficient documentation

## 2024-04-27 DIAGNOSIS — Z6834 Body mass index (BMI) 34.0-34.9, adult: Secondary | ICD-10-CM | POA: Insufficient documentation

## 2024-04-27 DIAGNOSIS — E669 Obesity, unspecified: Secondary | ICD-10-CM | POA: Insufficient documentation

## 2024-04-27 DIAGNOSIS — N1831 Chronic kidney disease, stage 3a: Secondary | ICD-10-CM | POA: Diagnosis not present

## 2024-04-27 DIAGNOSIS — F418 Other specified anxiety disorders: Secondary | ICD-10-CM | POA: Diagnosis not present

## 2024-04-27 DIAGNOSIS — E1151 Type 2 diabetes mellitus with diabetic peripheral angiopathy without gangrene: Secondary | ICD-10-CM | POA: Diagnosis not present

## 2024-04-27 LAB — BASIC METABOLIC PANEL WITH GFR
Anion gap: 10 (ref 5–15)
BUN: 25 mg/dL — ABNORMAL HIGH (ref 8–23)
CO2: 30 mmol/L (ref 22–32)
Calcium: 10.1 mg/dL (ref 8.9–10.3)
Chloride: 96 mmol/L — ABNORMAL LOW (ref 98–111)
Creatinine, Ser: 1.2 mg/dL — ABNORMAL HIGH (ref 0.44–1.00)
GFR, Estimated: 50 mL/min — ABNORMAL LOW (ref >60)
Glucose, Bld: 146 mg/dL — ABNORMAL HIGH (ref 70–99)
Potassium: 4 mmol/L (ref 3.5–5.1)
Sodium: 136 mmol/L (ref 135–145)

## 2024-04-27 LAB — CBC
HCT: 41.8 % (ref 36.0–46.0)
Hemoglobin: 13.4 g/dL (ref 12.0–15.0)
MCH: 27.5 pg (ref 26.0–34.0)
MCHC: 32.1 g/dL (ref 30.0–36.0)
MCV: 85.8 fL (ref 80.0–100.0)
Platelets: 167 K/uL (ref 150–400)
RBC: 4.87 MIL/uL (ref 3.87–5.11)
RDW: 14.2 % (ref 11.5–15.5)
WBC: 7.2 K/uL (ref 4.0–10.5)
nRBC: 0 % (ref 0.0–0.2)

## 2024-04-27 MED ORDER — METOPROLOL SUCCINATE ER 25 MG PO TB24: 12.5000 mg | ORAL_TABLET | Freq: Every day | ORAL | 3 refills | Status: AC

## 2024-04-27 NOTE — Patient Instructions (Signed)
 Medication Changes:  DECREASE METOPROLOL  SUCCINATE TO 12.5MG  DAILY AT BEDTIME   Lab Work:  Labs done today, your results will be available in MyChart, we will contact you for abnormal readings.  Follow-Up in: 4 MONTHS WITH DR. ROLAN PLEASE CALL OUR OFFICE AROUND OCTOBER TO GET SCHEDULED FOR YOUR APPOINTMENT. PHONE NUMBER IS [phone] OPTION 2   At the Advanced [facility] Failure Clinic, you and your health needs are our priority. We have a designated team specialized in the treatment of Heart Failure. This Care Team includes your primary Heart Failure Specialized Cardiologist (physician), Advanced Practice Providers (APPs- Physician Assistants and Nurse Practitioners), and Pharmacist who all work together to provide you with the care you need, when you need it.   You may see any of the following providers on your designated Care Team at your next follow up:  Dr. Toribio Fuel Dr. Ezra ROLAN Dr. Ria Commander Dr. Odis Brownie Greig Mosses, NP Caffie Shed, [place] [facility] [place], [place] Beckey Coe, NP [place] Lee, NP Tinnie Redman, PharmD   Please be sure to bring in all your medications bottles to every appointment.   Need to Contact Us :  If you have any questions or concerns before your next appointment please send us  a message through [place] or call our office at [phone].    TO LEAVE A MESSAGE FOR THE NURSE SELECT OPTION 2, PLEASE LEAVE A MESSAGE INCLUDING: YOUR NAME DATE OF BIRTH CALL BACK NUMBER REASON FOR CALL**this is important as we prioritize the call backs  YOU WILL RECEIVE A CALL BACK THE SAME DAY AS LONG AS YOU CALL BEFORE 4:00 PM

## 2024-04-30 ENCOUNTER — Ambulatory Visit: Attending: Internal Medicine | Admitting: Pharmacist

## 2024-04-30 ENCOUNTER — Encounter: Payer: Self-pay | Admitting: Pharmacist

## 2024-04-30 VITALS — Ht 63.0 in | Wt 196.0 lb

## 2024-04-30 DIAGNOSIS — E669 Obesity, unspecified: Secondary | ICD-10-CM | POA: Insufficient documentation

## 2024-04-30 MED ORDER — MOUNJARO 7.5 MG/0.5ML ~~LOC~~ SOAJ: 7.5000 mg | SUBCUTANEOUS | 0 refills | Status: DC

## 2024-04-30 NOTE — Patient Instructions (Signed)
 GLP1 Agonist Titration Plan:  Will plan to follow the titration plan as below, pending patient is tolerating each dose before increasing to the next. Can slow titration if needed for tolerability.      Mounjaro : -Month 1: Inject Mounjaro  2.5 mg SQ once weekly x 4 weeks -Month 2: Inject Mounjaro  5 mg SQ once weekly x 4 weeks -Month 3: Inject Mounjaro  7.5 mg SQ once weekly x 4 weeks -Month 4: Inject Mounjaro  10 mg SQ once weekly X 4 weeks -Month 5: Inject Mounjaro  12.5 mg mg SQ once weekly x 4 weeks -Month 6+: Inject Mounjaro  15 mg  SQ once weekly

## 2024-04-30 NOTE — Progress Notes (Signed)
 Patient ID: Andrea Mueller                 DOB: 1956/04/27                    MRN: [id number]     HPI: Andrea Mueller is a 68 y.o. female patient referred to pharmacy clinic by Dr.McLean  to initiate GLP1-RA therapy. PMH is significant for T2DM, CAD, CKDand obesity. Most recent BMI 34.98 kg/m .  Baseline weight and BMI: 209 lbs 37.0 kg/m  Current weight and BMI: 196 lbs  34.98 kg/m  Current meds that affect weight: basal insulin   Goal weight 150 lbs  Other diabetic medication Lantus   - 30 units  FBG - 114 mg/dl  CGM past 30 days -time in range  70-80 mg/dl -21% time 818-749- 80% of the time and >250 -3% of the time time     Diet: have been to nutritionist and learning to eat low carb high protein diet   Exercise: none - willing to implement some routine suggest to start with 15 -20 min walks with some resistance 2 days a week and work the way up to 150 min of moderate intensity cardio per week along with resistance exercise 3 days per week     Social History:  Alcohol: none  Smoking : none  Labs: Lab Results  Component Value Date   HGBA1C 7.2 (H) 06/03/2021    Wt Readings from Last 1 Encounters:  04/30/24 196 lb (88.9 kg)    BP Readings from Last 1 Encounters:  04/27/24 118/60   Pulse Readings from Last 1 Encounters:  04/27/24 (!) 54       Component Value Date/Time   CHOL 139 01/06/2024 1230   TRIG 152 (H) 01/06/2024 1230   HDL 43 01/06/2024 1230   CHOLHDL 3.2 01/06/2024 1230   VLDL 30 01/06/2024 1230   LDLCALC 66 01/06/2024 1230    Past Medical History:  Diagnosis Date   CAD (coronary artery disease), native coronary artery    CKD (chronic kidney disease) stage 3, GFR 30-59 ml/min (HCC)    Type 2 diabetes mellitus treated without insulin  (HCC)     Current Outpatient Medications on File Prior to Visit  Medication Sig Dispense Refill   acetaminophen  (TYLENOL ) 325 MG tablet Take 325 mg by mouth every 6 (six) hours as needed for moderate pain or headache.      aspirin  81 MG EC tablet Take 1 tablet (81 mg total) by mouth daily. Swallow whole. 30 tablet 11   atorvastatin  (LIPITOR ) 80 MG tablet Take 1 tablet by mouth once daily 30 tablet 11   busPIRone  (BUSPAR ) 5 MG tablet Take 5 mg by mouth 2 (two) times daily.     dapagliflozin  propanediol (FARXIGA ) 10 MG TABS tablet Take 1 tablet (10 mg total) by mouth daily. 90 tablet 3   insulin  NPH-regular Human (70-30) 100 UNIT/ML injection Inject 25-28 Units into the skin 2 (two) times daily with a meal. Patient takes 25 units in the morning and  only take 28 units at night.     LORazepam (ATIVAN) 0.5 MG tablet Take 0.25-0.5 mg by mouth daily as needed for anxiety.     melatonin 5 MG TABS Take 5 mg by mouth at bedtime.     metolazone  (ZAROXOLYN ) 2.5 MG tablet Take 2.5 mg by mouth once a week. As directed by AHF     metoprolol  succinate (TOPROL  XL) 25 MG 24 hr tablet Take 0.5 tablets (12.5  mg total) by mouth at bedtime. 45 tablet 3   Omega-3 Fatty Acids (FISH OIL) 1000 MG CAPS Take 1,000 mg by mouth daily.     rivaroxaban  (XARELTO ) 2.5 MG TABS tablet Take 1 tablet (2.5 mg total) by mouth 2 (two) times daily. 180 tablet 3   sacubitril-valsartan (ENTRESTO ) 24-26 MG Take 1 tablet by mouth twice daily 60 tablet 11   senna-docusate (SENOKOT-S) 8.6-50 MG tablet Take 1 tablet by mouth daily. As needed     sertraline  (ZOLOFT ) 50 MG tablet Take 1 tablet by mouth once daily 30 tablet 0   spironolactone  (ALDACTONE ) 25 MG tablet Take 25 mg by mouth daily.     torsemide  (DEMADEX ) 20 MG tablet Take 2 tablets by mouth once daily 180 tablet 3   No current facility-administered medications on file prior to visit.    Allergies  Allergen Reactions   Statins     Other reaction(s): Other Myalgia   Codeine Nausea Only     Assessment/Plan:  1. Weight loss -   Problem  Obesity (Bmi 30-39.9)   Baseline weight and BMI: 209 lbs 37.0 kg/m  Current weight and BMI: 196 lbs  34.98 kg/m  Current meds that affect weight:  basal insulin      Obesity (BMI 30-39.9) Patient has not met goal weight with comprehensive lifestyle modifications and Ozempic  in the past 3 months. Also on basal insulin  along with Ozempic for glucose management. FBG - 114 mg/dl,CGM past 30 days -time in range  70-80 mg/dl -21% time 818-749- 80% of the time and >250 -3% of the time time. Switched Ozempic 1 mg dose to Mounjaro  7.5 mg once a week. Pt is advised to reduce Lantus  dose by 3-5 units as needed with goal to be off of insulin  as we titrate Mounjaro  dose up to achieve max weight loss benefit from Mounjaro . Confirmed patient has no personal or family history of medullary thyroid carcinoma (MTC) or Multiple Endocrine Neoplasia syndrome type 2 (MEN 2). Injection technique reviewed at today's visit.  Advised patient on common side effects including nausea, diarrhea, dyspepsia, decreased appetite, and fatigue. Counseled patient on reducing meal size and how to titrate medication to minimize side effects. Counseled patient to call if intolerable side effects or if experiencing dehydration, abdominal pain, or dizziness. Along with pharmacotherapy, the patient will follow dietary modifications and aim for at least 150 minutes of moderate-intensity exercise per week, plus resistance training twice a week (as recommended by the American Heart Association). This resistance training--such as weightlifting, bodyweight exercises, or using resistance bands, adapted to the patient's ability--will help prevent muscle loss.  Follow up in 4 weeks for further dose titration.     Andrea Mueller, Pharm.D Andrea Mueller. [facility] [address], [place], [place] [address] Phone: [phone]; Fax: [phone]

## 2024-04-30 NOTE — Assessment & Plan Note (Addendum)
 Patient has not met goal weight with comprehensive lifestyle modifications and Ozempic  in the past 3 months. Also on basal insulin  along with Ozempic for glucose management. FBG - 114 mg/dl,CGM past 30 days -time in range  70-80 mg/dl -21% time 818-749- 80% of the time and >250 -3% of the time time. Switched Ozempic 1 mg dose to Mounjaro  7.5 mg once a week. Pt is advised to reduce Lantus  dose by 3-5 units as needed with goal to be off of insulin  as we titrate Mounjaro  dose up to achieve max weight loss benefit from Mounjaro . Confirmed patient has no personal or family history of medullary thyroid carcinoma (MTC) or Multiple Endocrine Neoplasia syndrome type 2 (MEN 2). Injection technique reviewed at today's visit.  Advised patient on common side effects including nausea, diarrhea, dyspepsia, decreased appetite, and fatigue. Counseled patient on reducing meal size and how to titrate medication to minimize side effects. Counseled patient to call if intolerable side effects or if experiencing dehydration, abdominal pain, or dizziness. Along with pharmacotherapy, the patient will follow dietary modifications and aim for at least 150 minutes of moderate-intensity exercise per week, plus resistance training twice a week (as recommended by the American Heart Association). This resistance training--such as weightlifting, bodyweight exercises, or using resistance bands, adapted to the patient's ability--will help prevent muscle loss.  Follow up in 4 weeks for further dose titration.

## 2024-05-23 ENCOUNTER — Other Ambulatory Visit: Payer: Self-pay | Admitting: Cardiology

## 2024-06-20 ENCOUNTER — Other Ambulatory Visit: Payer: Self-pay | Admitting: Cardiology

## 2024-06-25 ENCOUNTER — Other Ambulatory Visit: Payer: Self-pay | Admitting: Pharmacist Clinician (PhC)/ Clinical Pharmacy Specialist

## 2024-06-25 MED ORDER — MOUNJARO 12.5 MG/0.5ML ~~LOC~~ SOAJ: 12.5000 mg | SUBCUTANEOUS | 0 refills | Status: DC

## 2024-07-19 ENCOUNTER — Other Ambulatory Visit: Payer: Self-pay | Admitting: Cardiology

## 2024-07-20 ENCOUNTER — Other Ambulatory Visit (HOSPITAL_COMMUNITY): Payer: Self-pay | Admitting: Cardiology

## 2024-08-24 ENCOUNTER — Other Ambulatory Visit (HOSPITAL_COMMUNITY): Payer: Self-pay

## 2024-08-25 ENCOUNTER — Other Ambulatory Visit (HOSPITAL_COMMUNITY): Payer: Self-pay | Admitting: Cardiology

## 2024-09-07 ENCOUNTER — Encounter (HOSPITAL_COMMUNITY): Payer: Self-pay | Admitting: Cardiology

## 2024-09-07 ENCOUNTER — Ambulatory Visit (HOSPITAL_COMMUNITY): Payer: Self-pay | Admitting: Cardiology

## 2024-09-07 ENCOUNTER — Ambulatory Visit (HOSPITAL_COMMUNITY)
Admission: RE | Admit: 2024-09-07 | Discharge: 2024-09-07 | Disposition: A | Source: Ambulatory Visit | Attending: Cardiology | Admitting: Cardiology

## 2024-09-07 VITALS — BP 102/64 | HR 51

## 2024-09-07 DIAGNOSIS — E1122 Type 2 diabetes mellitus with diabetic chronic kidney disease: Secondary | ICD-10-CM | POA: Diagnosis not present

## 2024-09-07 DIAGNOSIS — I255 Ischemic cardiomyopathy: Secondary | ICD-10-CM | POA: Diagnosis present

## 2024-09-07 DIAGNOSIS — E1151 Type 2 diabetes mellitus with diabetic peripheral angiopathy without gangrene: Secondary | ICD-10-CM | POA: Diagnosis not present

## 2024-09-07 DIAGNOSIS — Z7901 Long term (current) use of anticoagulants: Secondary | ICD-10-CM | POA: Diagnosis not present

## 2024-09-07 DIAGNOSIS — I2510 Atherosclerotic heart disease of native coronary artery without angina pectoris: Secondary | ICD-10-CM | POA: Insufficient documentation

## 2024-09-07 DIAGNOSIS — Z7985 Long-term (current) use of injectable non-insulin antidiabetic drugs: Secondary | ICD-10-CM | POA: Diagnosis not present

## 2024-09-07 DIAGNOSIS — E669 Obesity, unspecified: Secondary | ICD-10-CM | POA: Diagnosis not present

## 2024-09-07 DIAGNOSIS — F418 Other specified anxiety disorders: Secondary | ICD-10-CM | POA: Insufficient documentation

## 2024-09-07 DIAGNOSIS — Z951 Presence of aortocoronary bypass graft: Secondary | ICD-10-CM | POA: Diagnosis not present

## 2024-09-07 DIAGNOSIS — I5022 Chronic systolic (congestive) heart failure: Secondary | ICD-10-CM | POA: Diagnosis not present

## 2024-09-07 DIAGNOSIS — Z7984 Long term (current) use of oral hypoglycemic drugs: Secondary | ICD-10-CM | POA: Diagnosis not present

## 2024-09-07 DIAGNOSIS — Z7982 Long term (current) use of aspirin: Secondary | ICD-10-CM | POA: Insufficient documentation

## 2024-09-07 DIAGNOSIS — N1831 Chronic kidney disease, stage 3a: Secondary | ICD-10-CM | POA: Insufficient documentation

## 2024-09-07 DIAGNOSIS — Z79899 Other long term (current) drug therapy: Secondary | ICD-10-CM | POA: Diagnosis not present

## 2024-09-07 DIAGNOSIS — I5042 Chronic combined systolic (congestive) and diastolic (congestive) heart failure: Secondary | ICD-10-CM | POA: Diagnosis present

## 2024-09-07 DIAGNOSIS — Z87891 Personal history of nicotine dependence: Secondary | ICD-10-CM | POA: Diagnosis not present

## 2024-09-07 LAB — LIPID PANEL
Cholesterol: 137 mg/dL (ref 0–200)
HDL: 45 mg/dL
LDL Cholesterol: 63 mg/dL (ref 0–99)
Total CHOL/HDL Ratio: 3 ratio
Triglycerides: 146 mg/dL
VLDL: 29 mg/dL (ref 0–40)

## 2024-09-07 LAB — BASIC METABOLIC PANEL WITH GFR
Anion gap: 11 (ref 5–15)
BUN: 33 mg/dL — ABNORMAL HIGH (ref 8–23)
CO2: 28 mmol/L (ref 22–32)
Calcium: 9.5 mg/dL (ref 8.9–10.3)
Chloride: 100 mmol/L (ref 98–111)
Creatinine, Ser: 1.53 mg/dL — ABNORMAL HIGH (ref 0.44–1.00)
GFR, Estimated: 37 mL/min — ABNORMAL LOW
Glucose, Bld: 113 mg/dL — ABNORMAL HIGH (ref 70–99)
Potassium: 4.2 mmol/L (ref 3.5–5.1)
Sodium: 139 mmol/L (ref 135–145)

## 2024-09-07 LAB — PRO BRAIN NATRIURETIC PEPTIDE: Pro Brain Natriuretic Peptide: 859 pg/mL — ABNORMAL HIGH

## 2024-09-07 MED ORDER — EZETIMIBE 10 MG PO TABS: 10.0000 mg | ORAL_TABLET | Freq: Every day | ORAL | 3 refills | Status: AC

## 2024-09-07 NOTE — Patient Instructions (Signed)
 There has been no changes to your medications.  Labs done today, your results will be available in MyChart, we will contact you for abnormal readings.  REPEAT blood work in 3 months.  Your physician recommends that you schedule a follow-up appointment in: 6 months ( July) ** PLEASE CALL THE OFFICE MAY TO ARRANGE YOUR FOLLOW UP APPOINTMENT.**  If you have any questions or concerns before your next appointment please send us  a message through [place] or call our office at [phone].    TO LEAVE A MESSAGE FOR THE NURSE SELECT OPTION 2, PLEASE LEAVE A MESSAGE INCLUDING: YOUR NAME DATE OF BIRTH CALL BACK NUMBER REASON FOR CALL**this is important as we prioritize the call backs  YOU WILL RECEIVE A CALL BACK THE SAME DAY AS LONG AS YOU CALL BEFORE 4:00 PM  At the [facility], you and your health needs are our priority. As part of our continuing mission to provide you with exceptional heart care, we have created designated Provider Care Teams. These Care Teams include your primary Cardiologist (physician) and Advanced Practice Providers (APPs- Physician Assistants and Nurse Practitioners) who all work together to provide you with the care you need, when you need it.   You may see any of the following providers on your designated Care Team at your next follow up: Dr Toribio Fuel Dr Ezra Shuck Dr. Morene Brownie Greig Mosses, NP Caffie Shed, [place] [facility] [place], [place] Beckey Coe, NP Jordan Lee, NP Ellouise Class, NP Tinnie Redman, PharmD Jaun Bash, PharmD   Please be sure to bring in all your medications bottles to every appointment.    Thank you for choosing [facility]

## 2024-09-07 NOTE — Telephone Encounter (Addendum)
 Pt aware, agreeable, and verbalized understanding   Rx sent.will get repeat labs done at her PCP office  ----- Message from Ezra Shuck, MD sent at 09/07/2024  3:49 PM EST ----- Goal LDL < 55, let's have her add Zetia  10 mg daily. Lipids in 2 months.

## 2024-09-08 NOTE — Progress Notes (Signed)
 "  Advanced Heart Failure Clinic Note    PCP: [place] Dragon, PA-C HF Cardiologist: Dr. Rolan  Chief complaint: CHF  HPI: Andrea Mueller is a 69 y.o. female with hx of CAD s/p 4v CABG (11/2020), type 2 diabetes mellitus, anxiety/depression, CKD IIIa, and ischemic cardiomyopathy.  She was diagnosed with coronary artery disease back in April 2022 after presenting to a hospital in [place] with fatigue and diaphoresis.  She underwent four-vessel CABG in [place] and appeared to initially recover quite well.  She returned to her home in [place] in May 2022, and was hospitalized 5/22 for decompensated heart failure and right lower leg cellulitis.  Her echo revealed normal LV systolic function.    Admit to [facility] in [facility] 07/22 with acute heart failure.  Echo revealed newly reduced LV systolic function with moderate mitral regurgitation. LHC demonstrated severe native disease, and occluded vein graft to a diagonal, and otherwise patent bypass grafts.  She had elevated biventricular filling pressures with a normal cardiac index, although this was on a small amount of dobutamine.  She was diuresed and inotropes wean was attempted. She was reportedly hypotensive, and inotropes were restarted.  She was transferred to [facility], on dobutamine and milrinone , for further management and advanced therapies. Inotropes were gradually weaned off.   CPX: 09/22 showed mild HF limitation. Obesity playing a role. Mild chronotropic incompetence.  Echo 10/22 showed EF up to 45-50%.  Echo 12/22 showed EF 50-55% with apical aneurysm, no LV thrombus, mildly decreased RV systolic function, IVC normal.   Echo 2/24 showed EF 40-45%, mild RV dysfunction, normal IVC.    Had osteomyelitis involving left 2nd toe, now s/p amputation.  Peripheral angiography in 6/24 showed severe tibioperoneal disease on left without interventional option.   Sleep study 9/24 with no OSA.   Echo 5/25 showed EF 45-50% with basal  inferior akinesis and basal inferolateral hypokinesis, mild RV dilation with normal RV systolic function, IVC normal.   Today she returns for HF follow up with her son.  She is now on tirzepatide , weight down 21 lbs.  Breathing is better.  She denies dysnea with usual activities.  No orthopnea/PND.  She has rare atypical chest pain.  No lightheadedness or palpitations.  Doing some walking for exercise. No claudication.   ECG (personally reviewed): NSR, RBBB, old ALMI  Labs (02/24): K 4.2, creatinine 1.16, BNP 390, LDL 28,  Labs (04/24): A1c 8.8% Labs (6/24): LDL 33 Labs (8/24): K 5, creatinine 1.36 Labs (9/24): K 4.9, creatinine 1.33 Labs (12/24): K 4.8, creatinine 1.46 Labs (5/25): K 4.9, creatinine 1.36, LDL 66 Labs (9/25): K 4, creatinine 1.2  PMH: 1. CAD: CABG (4/22) with SVG-D, SVG-OM, LIMA-LAD, SVG RCA.  - Coronary angiography (7/22): Totally occluded mid LAD, LCx, and RCA; 80-90% stenosis small D1; patent LIMA-small, diseased LAD, patent SVG-RCA with small PDA and PLV, patent SVG-OM but 90% stenosis in small native vessel at anastomosis, occluded SVG-D.  Medical management.  2. Chronic systolic CHF: Ischemic cardiomyopathy.  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 - Echo (2/24): EF 40-45%, mild RV dysfunction, normal IVC.  - Echo (5/25): EF 45-50% with basal inferior akinesis and basal inferolateral hypokinesis, mild  RV dilation with normal RV systolic function, IVC normal.  3. CKD stage 3.  4. Type 2 diabetes.  5. Anxiety 6. Rheumatoid arthritis 7. 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  8. 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  9. PAD: Peripheral angiography  in 6/24 showed severe tibioperoneal disease on left.   Current Outpatient Medications  Medication Sig Dispense Refill   acetaminophen  (TYLENOL ) 325 MG tablet Take 325 mg by mouth every 6 (six) hours as needed for moderate pain or headache.     aspirin  81 MG EC tablet Take 1 tablet (81 mg total) by mouth daily. Swallow whole. 30 tablet 11   atorvastatin  (LIPITOR ) 80 MG tablet Take 1 tablet by mouth once daily 30 tablet 11   busPIRone  (BUSPAR ) 5 MG tablet Take 5 mg by mouth 2 (two) times daily.     dapagliflozin  propanediol (FARXIGA ) 10 MG TABS tablet Take 1 tablet (10 mg total) by mouth daily. 90 tablet 3   insulin  NPH-regular Human (70-30) 100 UNIT/ML injection Inject 25-28 Units into the skin 2 (two) times daily with a meal. Patient takes 25 units in the morning and  only take 28 units at night. (Patient taking differently: Inject 16 Units into the skin daily with breakfast. Patient takes 25 units in the morning and  only take 28 units at night.)     metoprolol  succinate (TOPROL  XL) 25 MG 24 hr tablet Take 0.5 tablets (12.5 mg total) by mouth at bedtime. 45 tablet 3   Omega-3 Fatty Acids (FISH OIL) 1000 MG CAPS Take 1,000 mg by mouth daily.     rivaroxaban  (XARELTO ) 2.5 MG TABS tablet Take 1 tablet (2.5 mg total) by mouth 2 (two) times daily. 180 tablet 3   sacubitril-valsartan (ENTRESTO ) 24-26 MG Take 1 tablet by mouth twice daily 60 tablet 5   senna-docusate (SENOKOT-S) 8.6-50 MG tablet Take 1 tablet by mouth daily. As needed     sertraline  (ZOLOFT ) 50 MG tablet Take 1 tablet by mouth once daily 30 tablet 0   spironolactone  (ALDACTONE ) 25 MG tablet Take 25 mg by mouth daily.     tirzepatide  (MOUNJARO ) 12.5 MG/0.5ML Pen INJECT 12.5 MG SUBCUTANEOUSLY  ONCE A WEEK 2 mL 11   torsemide  (DEMADEX ) 20 MG tablet Take 2 tablets by mouth once daily 180 tablet 0   ezetimibe  (ZETIA ) 10 MG tablet Take 1 tablet (10 mg total) by mouth daily. 90 tablet 3   metolazone  (ZAROXOLYN ) 2.5 MG tablet Take 2.5 mg by  mouth once a week. As directed by AHF (Patient not taking: Reported on 09/07/2024)     No current facility-administered medications for this encounter.   Allergies  Allergen Reactions   Statins     Other reaction(s): Other Myalgia   Codeine Nausea Only   Social History   Socioeconomic History   Marital status: Widowed    Spouse name: Not on file   Number of children: Not on file   Years of education: Not on file   Highest education level: Not on file  Occupational History   Not on file  Tobacco Use   Smoking status: Former    Current packs/day: 0.00    Average packs/day: 1 pack/day for 15.0 years (15.0 ttl pk-yrs)    Types: Cigarettes    Start date: 08/16/1993    Quit date: 08/16/2000    Years since quitting: 24.0   Smokeless tobacco: Never  Substance and Sexual Activity   Alcohol use: Not Currently   Drug use: Not Currently  Sexual activity: Not Currently  Other Topics Concern   Not on file  Social History Narrative   Not on file   Social Drivers of Health   Tobacco Use: Medium Risk (09/07/2024)   Patient History    Smoking Tobacco Use: Former    Smokeless Tobacco Use: Never    Passive Exposure: Not on file  Financial Resource Strain: Low Risk (06/18/2024)   Received from [facility]   Overall Financial Resource Strain (CARDIA)    How hard is it for you to pay for the very basics like food, housing, medical care, and heating?: Not very hard  Food Insecurity: No Food Insecurity (06/18/2024)   Received from [facility]   Epic    Within the past 12 months, you worried that your food would run out before you got the money to buy more.: Never true    Within the past 12 months, the food you bought just didn't last and you didn't have money to get more.: Never true  Transportation Needs: No Transportation Needs (06/18/2024)   Received from [facility]    In the past 12 months, has lack of transportation kept you from medical appointments or from getting  medications?: No    In the past 12 months, has lack of transportation kept you from meetings, work, or from getting things needed for daily living?: No  Physical Activity: Insufficiently Active (06/18/2024)   Received from [facility]   Exercise Vital Sign    On average, how many days per week do you engage in moderate to strenuous exercise (like a brisk walk)?: 3 days    On average, how many minutes do you engage in exercise at this level?: 20 min  Stress: No Stress Concern Present (06/18/2024)   Received from [facility] - Occupational Stress Questionnaire    Do you feel stress - tense, restless, nervous, or anxious, or unable to sleep at night because your mind is troubled all the time - these days?: Only a little  Social Connections: Somewhat Isolated (06/18/2024)   Received from [facility]   Social Network    How would you rate your social network (family, work, friends)?: Restricted participation with some degree of social isolation  Intimate Partner Violence: Not At Risk (06/18/2024)   Received from [facility]   HITS    Over the last 12 months how often did your partner physically hurt you?: Never    Over the last 12 months how often did your partner insult you or talk down to you?: Never    Over the last 12 months how often did your partner threaten you with physical harm?: Never    Over the last 12 months how often did your partner scream or curse at you?: Never  Depression (PHQ2-9): Not on file  Alcohol Screen: Not on file  Housing: Low Risk (06/18/2024)   Received from [facility]    In the last 12 months, was there a time when you were not able to pay the mortgage or rent on time?: No    In the past 12 months, how many times have you moved where you were living?: 0    At any time in the past 12 months, were you homeless or living in a shelter (including now)?: No  Utilities: Not At Risk (06/18/2024)   Received from  [facility]    In the past 12  months has the electric, gas, oil, or water company threatened to shut off services in your home?: No  Health Literacy: Not on file   Family history: No premature CAD.   ROS: All systems reviewed and negative except as per HPI.   BP 102/64   Pulse (!) 51   SpO2 98%   Wt Readings from Last 3 Encounters:  04/30/24 88.9 kg (196 lb)  04/27/24 89.5 kg (197 lb 6.4 oz)  01/06/24 94.9 kg (209 lb 3.2 oz)   General: NAD Neck: No JVD, no thyromegaly or thyroid nodule.  Lungs: Clear to auscultation bilaterally with normal respiratory effort. CV: Nondisplaced PMI.  Heart regular S1/S2, no S3/S4, no murmur.  No peripheral edema.  No carotid bruit.  Difficult to palpate pedal pulses.  Abdomen: Soft, nontender, no hepatosplenomegaly, no distention.  Skin: Intact without lesions or rashes.  Neurologic: Alert and oriented x 3.  Psych: Normal affect. Extremities: No clubbing or cyanosis.  HEENT: Normal.   ASSESSMENT & PLAN: 1. Chronic Systolic CHF: Ischemic cardiomyopathy.  Echo at [facility] in 7/22 with EF 25-30%, moderate MR.  She was admitted to [facility] in 7/22 with CHF, started on inotropes and diuresed.  RHC showed elevated filling pressures and preserved CO (but was on inotropes at the time).  Unable to wean off inotropes successfully, sent to [facility] for consideration of LVAD. Able to wean off dobutamine and milrinone  successfully.  CPX showed mild HF limitation. Echo 10/22 showed EF up to 45-50%. Echo in 12/22 showed EF 50-55% with small apical aneurysm and no thrombus. Echo in 2/24 showed EF 40-45%, mild RV dysfunction, normal IVC. Echo 5/25 showed EF 45-50% with basal inferior akinesis and basal inferolateral hypokinesis, mild RV dilation with normal RV systolic function, IVC normal. NYHA class I-II, not volume overloaded.  - Continue Toprol  XL 12.5 mg at bedtime. - Continue Entresto  24/26 bid.  BMET/BNP today.  - Continue torsemide  40 mg daily.   - Continue  Farxiga  10 mg daily.  - Continue spironolactone  25 mg daily.  - I will arrange for repeat echo in 5/26.  EF has been out of ICD range.  2. CAD: s/p CABG x 4 (4/22).  Cath in 7/22 with 3/4 grafts patent, occluded SVG-small D.  Native vessels, however, were small and diseased (diabetic coronaries). She has rare atypical chest pain.  - Continue ASA 81. - With PAD and CAD, now off Plavix  and on rivaroxaban  2.5 mg bid.  - Continue atorvastatin  80 mg daily.  Check lipids today. 3. CKD stage 3a: She is followed by Nephrology. - Continue Farxiga .  BMET today 4. PAD: Severe tibioperoneal disease on left, not amenable to intervention. No claudication per her report.  - Peripheral arterial dopplers 6/25 showed calcified vessels, ABIs likely not accurate.  6. Obesity: There is no height or weight on file to calculate BMI. - She is now on tirzepatide , weight trending down.   Follow up in 6 months  I spent 31 minutes reviewing records, interviewing/examining patient, and managing orders.   Ezra Shuck, MD 09/08/24  "

## 2024-10-12 ENCOUNTER — Ambulatory Visit: Admitting: Cardiology

## 2024-12-26 ENCOUNTER — Other Ambulatory Visit (HOSPITAL_COMMUNITY)
